# Patient Record
Sex: Female | Born: 1965 | Race: Black or African American | Hispanic: No | Marital: Single | Smoking: Former smoker
Health system: Southern US, Community
[De-identification: ages and names within clinical notes are randomized; demographics above are authoritative.]

## PROBLEM LIST (undated history)

## (undated) ENCOUNTER — Ambulatory Visit: Admission: EM | Payer: Self-pay | Source: Home / Self Care

## (undated) DIAGNOSIS — E119 Type 2 diabetes mellitus without complications: Secondary | ICD-10-CM

## (undated) DIAGNOSIS — G4730 Sleep apnea, unspecified: Secondary | ICD-10-CM

## (undated) DIAGNOSIS — R609 Edema, unspecified: Secondary | ICD-10-CM

## (undated) DIAGNOSIS — I10 Essential (primary) hypertension: Secondary | ICD-10-CM

## (undated) DIAGNOSIS — J449 Chronic obstructive pulmonary disease, unspecified: Secondary | ICD-10-CM

## (undated) DIAGNOSIS — I509 Heart failure, unspecified: Secondary | ICD-10-CM

## (undated) DIAGNOSIS — M1990 Unspecified osteoarthritis, unspecified site: Secondary | ICD-10-CM

## (undated) HISTORY — DX: Sleep apnea, unspecified: G47.30

## (undated) HISTORY — PX: HERNIA REPAIR: SHX51

## (undated) HISTORY — PX: TUBAL LIGATION: SHX77

---

## 2011-09-25 ENCOUNTER — Emergency Department (HOSPITAL_COMMUNITY)
Admission: EM | Admit: 2011-09-25 | Discharge: 2011-09-25 | Disposition: A | Discharge status: Home Health | Payer: Medicaid Other | Attending: Emergency Medicine | Admitting: Emergency Medicine

## 2011-09-25 ENCOUNTER — Other Ambulatory Visit: Payer: Self-pay

## 2011-09-25 ENCOUNTER — Encounter (HOSPITAL_COMMUNITY): Payer: Self-pay | Admitting: Emergency Medicine

## 2011-09-25 DIAGNOSIS — E669 Obesity, unspecified: Secondary | ICD-10-CM | POA: Insufficient documentation

## 2011-09-25 DIAGNOSIS — Z79899 Other long term (current) drug therapy: Secondary | ICD-10-CM | POA: Insufficient documentation

## 2011-09-25 DIAGNOSIS — I10 Essential (primary) hypertension: Secondary | ICD-10-CM | POA: Insufficient documentation

## 2011-09-25 HISTORY — DX: Essential (primary) hypertension: I10

## 2011-09-25 LAB — DIFFERENTIAL
Eosinophils Absolute: 0.5 10*3/uL (ref 0.0–0.7)
Eosinophils Relative: 9 % — ABNORMAL HIGH (ref 0–5)
Lymphocytes Relative: 27 % (ref 12–46)
Lymphs Abs: 1.6 10*3/uL (ref 0.7–4.0)
Monocytes Relative: 6 % (ref 3–12)
Neutrophils Relative %: 58 % (ref 43–77)

## 2011-09-25 LAB — CBC
Hemoglobin: 13.2 g/dL (ref 12.0–15.0)
MCH: 28 pg (ref 26.0–34.0)
MCV: 84.9 fL (ref 78.0–100.0)
RBC: 4.71 MIL/uL (ref 3.87–5.11)
WBC: 6.1 10*3/uL (ref 4.0–10.5)

## 2011-09-25 LAB — POCT I-STAT, CHEM 8
BUN: 14 mg/dL (ref 6–23)
Creatinine, Ser: 0.8 mg/dL (ref 0.50–1.10)
Glucose, Bld: 103 mg/dL — ABNORMAL HIGH (ref 70–99)
Hemoglobin: 14.6 g/dL (ref 12.0–15.0)
Potassium: 4.1 mEq/L (ref 3.5–5.1)
Sodium: 140 mEq/L (ref 135–145)

## 2011-09-25 LAB — POCT I-STAT TROPONIN I

## 2011-09-25 MED ORDER — OLMESARTAN MEDOXOMIL 20 MG PO TABS
20.0000 mg | ORAL_TABLET | Freq: Every day | ORAL | Status: DC
  Filled 2011-09-25: qty 1

## 2011-09-25 MED ORDER — AMLODIPINE BESYLATE 5 MG PO TABS
5.0000 mg | ORAL_TABLET | ORAL | Status: DC
  Filled 2011-09-25: qty 1

## 2011-09-25 MED ORDER — AMLODIPINE BESYLATE-VALSARTAN 10-320 MG PO TABS: 1.0000 | ORAL_TABLET | Freq: Every day | ORAL | Status: DC

## 2011-09-25 MED ORDER — OLMESARTAN MEDOXOMIL 40 MG PO TABS
40.0000 mg | ORAL_TABLET | Freq: Every day | ORAL | Status: DC
  Administered 2011-09-25: 40 mg via ORAL
  Filled 2011-09-25: qty 1

## 2011-09-25 MED ORDER — AMLODIPINE BESYLATE 10 MG PO TABS
10.0000 mg | ORAL_TABLET | Freq: Once | ORAL | Status: AC
  Administered 2011-09-25: 10 mg via ORAL
  Filled 2011-09-25: qty 1

## 2011-09-25 MED ORDER — AMLODIPINE BESYLATE 5 MG PO TABS
5.0000 mg | ORAL_TABLET | Freq: Once | ORAL | Status: DC
  Filled 2011-09-25: qty 1

## 2011-09-25 NOTE — ED Provider Notes (Signed)
History     CSN: [id number]  Arrival date & time 09/25/11  4098   First MD Initiated Contact with Patient 09/25/11 [phone]      Chief Complaint  Patient presents with  . Hypertension    (Consider location/radiation/quality/duration/timing/severity/associated sxs/prior treatment) HPI  Past Medical History  Diagnosis Date  . Asthma   . Hypertension     Past Surgical History  Procedure Date  . Hernia repair   . Cesarean section w/btl     No family history on file.  History  Substance Use Topics  . Smoking status: Former Games developer  . Smokeless tobacco: Never Used  . Alcohol Use: No    OB History    Grav Para Term Preterm Abortions TAB SAB Ect Mult Living                  Review of Systems  Allergies  Review of patient's allergies indicates no known allergies.  Home Medications  No current outpatient prescriptions on file.  BP 179/105  Pulse 75  Temp(Src) 98.3 F (36.8 C) (Oral)  Resp 18  Ht 5\' 5"  (1.651 m)  Wt 298 lb (135.172 kg)  BMI 49.59 kg/m2  SpO2 98%  LMP 08/22/2011  Physical Exam  ED Course  Procedures (including critical care time)  Labs Reviewed  POCT I-STAT, CHEM 8 - Abnormal; Notable for the following:    Glucose, Bld 103 (*)    All other components within normal limits  CBC  DIFFERENTIAL   No results found.   No diagnosis found.    MDM  Duplicate note        Doug Sou, MD 09/25/11 1655

## 2011-09-25 NOTE — ED Provider Notes (Signed)
Medical screening examination/treatment/procedure(s) were performed by non-physician practitioner and as supervising physician I was immediately available for consultation/collaboration.  Doug Sou, MD 09/25/11 [phone]

## 2011-09-25 NOTE — ED Provider Notes (Signed)
History     CSN: [id number]  Arrival date & time 09/25/11  3086   First MD Initiated Contact with Patient 09/25/11 [phone]      Chief Complaint  Patient presents with  . Hypertension    (Consider location/radiation/quality/duration/timing/severity/associated sxs/prior treatment) Patient is a 46 y.o. female presenting with hypertension. The history is provided by the patient.  Hypertension This is a chronic problem. Pertinent negatives include no chills or fever.  Pt states she has history of htn, used to be on medications but has been off for a year. States hx of drug use and has been going through rehab. Two weeks ago went to a pcp for a physical. States no blood work was done, but she was examined and restarted on BP medications for elevated BP. States was started on exforge. State she has been taking this medicine for a week now, but her blood pressure continues to be hight. She has it checked at [facility] where [facility] is staying. Pt denies any pain, denies headache, chest pain, shortness of breath, swelling of exrimities. Today did not take her medications. States she came here because "blood pressure continues to be high."  No past medical history on file.  No past surgical history on file.  No family history on file.  History  Substance Use Topics  . Smoking status: Not on file  . Smokeless tobacco: Not on file  . Alcohol Use: Not on file    OB History    No data available      Review of Systems  Constitutional: Negative for fever and chills.  HENT: Negative.   Eyes: Negative.   Respiratory: Negative.   Cardiovascular: Negative.   Gastrointestinal: Negative.   Genitourinary: Negative.   Musculoskeletal: Negative.   Skin: Negative.   Neurological: Negative.   Psychiatric/Behavioral: Negative.     Allergies  Review of patient's allergies indicates not on file.  Home Medications  No current outpatient prescriptions on file.  BP 179/105  Pulse 75  Temp(Src)  98.3 F (36.8 C) (Oral)  Resp 18  Ht 5\' 5"  (1.651 m)  Wt 298 lb (135.172 kg)  BMI 49.59 kg/m2  SpO2 98%  Physical Exam  Nursing note and vitals reviewed. Constitutional: She is oriented to person, place, and time. She appears well-developed and well-nourished.       obese  HENT:  Head: Normocephalic and atraumatic.  Eyes: Conjunctivae are normal.  Neck: Neck supple.  Cardiovascular: Normal rate, regular rhythm and normal heart sounds.   Pulmonary/Chest: Effort normal and breath sounds normal. No respiratory distress. She exhibits no tenderness.  Abdominal: Soft. Bowel sounds are normal. She exhibits no distension. There is no tenderness.  Musculoskeletal: Normal range of motion. She exhibits no edema and no tenderness.  Neurological: She is alert and oriented to person, place, and time.  Skin: Skin is warm and dry.  Psychiatric: She has a normal mood and affect.    ED Course  Procedures (including critical care time)   Pt has no current complaints, specifically denies chest pain, head ache, blurred vision, nausea, vomiting, swelling of exrimiteis. Will get ECG, istat to check renal function. Troponin.   Results for orders placed during the hospital encounter of 09/25/11  CBC      Component Value Range   WBC 6.1  4.0 - 10.5 (K/uL)   RBC 4.71  3.87 - 5.11 (MIL/uL)   Hemoglobin 13.2  12.0 - 15.0 (g/dL)   HCT 69.6  29.5 - 28.4 (%)  MCV 84.9  78.0 - 100.0 (fL)   MCH 28.0  26.0 - 34.0 (pg)   MCHC 33.0  30.0 - 36.0 (g/dL)   RDW 62.1  30.8 - 65.7 (%)   Platelets 268  150 - 400 (K/uL)  DIFFERENTIAL      Component Value Range   Neutrophils Relative 58  43 - 77 (%)   Neutro Abs 3.5  1.7 - 7.7 (K/uL)   Lymphocytes Relative 27  12 - 46 (%)   Lymphs Abs 1.6  0.7 - 4.0 (K/uL)   Monocytes Relative 6  3 - 12 (%)   Monocytes Absolute 0.4  0.1 - 1.0 (K/uL)   Eosinophils Relative 9 (*) 0 - 5 (%)   Eosinophils Absolute 0.5  0.0 - 0.7 (K/uL)   Basophils Relative 1  0 - 1 (%)    Basophils Absolute 0.0  0.0 - 0.1 (K/uL)  POCT I-STAT, CHEM 8      Component Value Range   Sodium 140  135 - 145 (mEq/L)   Potassium 4.1  3.5 - 5.1 (mEq/L)   Chloride 107  96 - 112 (mEq/L)   BUN 14  6 - 23 (mg/dL)   Creatinine, Ser 8.46  0.50 - 1.10 (mg/dL)   Glucose, Bld 962 (*) 70 - 99 (mg/dL)   Calcium, Ion 9.52  8.41 - 1.32 (mmol/L)   TCO2 25  0 - 100 (mmol/L)   Hemoglobin 14.6  12.0 - 15.0 (g/dL)   HCT 32.4  40.1 - 02.7 (%)  POCT I-STAT TROPONIN I      Component Value Range   Troponin i, poc 0.00  0.00 - 0.08 (ng/mL)   Comment 3            No results found.   Date: 09/25/2011  Rate: 70  Rhythm: normal sinus rhythm  QRS Axis: normal  Intervals: normal  ST/T Wave abnormalities: nonspecific T wave changes  Conduction Disutrbances:none  Narrative Interpretation:   Old EKG Reviewed: none available  Given pt increased dose of her regula medication for BP which she did not take. BP down to 145/85. Will increase disages, d/c home with follow up. Also instructed to stop naprosyn, tylenol for pain.   No results found.   No diagnosis found.    MDM          Lottie Mussel, PA 09/25/11 1620

## 2011-09-25 NOTE — ED Notes (Signed)
Felt "knot" in throat on Saturday, hx of drug abuse, has been clean for several weeks, is staying a [employer]

## 2011-09-25 NOTE — ED Notes (Signed)
Complains of elevated blood pressure patient asymptomatic. Reports that she had anterior chest pain for 4 seconds on 09/21/11 resolve spontaneously without treatment .she denies shortness of breath throat pain nausea abdominal pain headache or any other complaint. Recently started on ex-forge 5-160 clinic in [facility]. [facility] moved [facility] On exam alert nontoxic lungs clear auscultation heart regular rate and rhythm no murmurs abdomen morbidly obese nontender. Neurologic Glasgow Coma Score 15 cranial nerves II through XII was all extremities well  Assessment strongly doubt end organ damage acutely Suggest blood pressure recheck 1 week. Chest pain felt to be nonspecific, brief and self-limiting Will increase dose ofex-forge   Doug Sou, MD 09/25/11 [phone]

## 2011-09-25 NOTE — Discharge Instructions (Signed)
See print out below for resources for your follow up. You can also try [facility]. Take new prescribed high blood pressure medicine daily. Follow up in 1-2 weeks   RESOURCE GUIDE  Dental Problems  Patients with Medicaid: [facility] [phone] [address]                                           [phone] [address] Phone:  [phone]                                                  Phone:  [phone]  If unable to pay or uninsured, contact:  [facility] or [facility]. to become qualified for the adult dental clinic.  Chronic Pain Problems Contact [facility]  [phone] Patients need to be referred by their primary care doctor.  Insufficient Money for Medicine Contact [employer]:  call "211" or [facility] [phone].  No Primary Care Doctor Call [facility]  [phone] Other agencies that provide inexpensive medical care    [facility]  [phone]    [facility]  [phone]    [facility]  [phone]    [facility]  [phone]    [facility]  [phone]    [facility]  [phone]  Psychological Services [facility]  [phone] [facility]  [phone] [facility]   [phone] (emergency services [phone])  Substance Abuse Resources [facility]  [phone] [facility] [phone] [facility] [phone] Floydene Flock [phone] Residential & Outpatient Substance Abuse Program  [phone]  Abuse/Neglect [facility] Child Abuse Hotline 930-503-0156 [facility] Child Abuse Hotline 949-529-1079 (After Hours)  Emergency Shelter [facility] Minist[facility] [phone]  Maternity Homes [facility] [place] [facility] [phone] [facility] [phone]  MRSA Hotline #:   602-138-9623    [facility]  Resources  [facility]     [employer]                          [facility] Dept. [address] [place]                       [address]      [address]  [place]                                                Cristobal Goldmann Phone:  [phone]  Phone:  [phone]                 Phone:  [phone]  [facility] Mental [facility] Phone:  [phone]  [facility] Child Abuse Hotline 940-418-4058 [phone] (After Hours)    Hypertension Information As your heart beats, it forces blood through your arteries. This force is your blood pressure. If the pressure is too high, it is called hypertension (HTN) or high blood pressure. HTN is dangerous because you may have it and not know it. High blood pressure may mean that your heart has to work harder to pump blood. Your arteries may be narrow or stiff. The extra work puts you at risk for heart disease, stroke, and other problems.  Blood pressure consists of two numbers, a higher number over a lower, 110/72, for example. It is stated as "110 over 72." The ideal is below 120 for the top number (systolic) and under 80 for the bottom (diastolic).  You should pay close attention to your blood pressure if you have certain conditions such as:  Heart failure.   Prior heart attack.   Diabetes   Chronic kidney disease.   Prior stroke.   Multiple risk factors for heart disease.  To see if you have HTN, your blood pressure should be measured while you are seated with your arm held at the level of the heart. It should be measured at least twice. A one-time elevated blood pressure reading (especially in the Emergency Department) does not mean that you need treatment. There may be conditions in which the blood pressure is different between your right and left arms. It is important to see your caregiver soon for a recheck. Most people  have essential hypertension which means that there is not a specific cause. This type of high blood pressure may be lowered by changing lifestyle factors such as:  Stress.   Smoking.   Lack of exercise.   Excessive weight.   Drug/tobacco/alcohol use.   Eating less salt.  Most people do not have symptoms from high blood pressure until it has caused damage to the body. Effective treatment can often prevent, delay or reduce that damage. TREATMENT  Treatment for high blood pressure, when a cause has been identified, is directed at the cause. There are a large number of medications to treat HTN. These fall into several categories, and your caregiver will help you select the medicines that are best for you. Medications may have side effects. You should review side effects with your caregiver. If your blood pressure stays high after you have made lifestyle changes or started on medicines,   Your medication(s) may need to be changed.   Other problems may need to be addressed.   Be certain you understand your prescriptions, and know how and when to take your medicine.   Be sure to follow up with your caregiver within the time frame advised (usually within two weeks) to have your blood pressure rechecked and to review your medications.   If you are taking more than one medicine to lower your blood pressure, make sure you know how and at what times they should be taken. Taking two medicines at the same time can result in blood pressure that is too low.  Document Released: 09/24/2005 Document Revised: 04/03/2011 Document Reviewed: 10/01/2007 [facility] Patient Information 2012 [place], [place].

## 2011-10-16 ENCOUNTER — Other Ambulatory Visit: Payer: Self-pay

## 2011-10-16 ENCOUNTER — Emergency Department (HOSPITAL_COMMUNITY)
Admission: EM | Admit: 2011-10-16 | Discharge: 2011-10-17 | Disposition: A | Discharge status: Home / Self Care | Payer: Medicaid Other | Attending: Emergency Medicine | Admitting: Emergency Medicine

## 2011-10-16 ENCOUNTER — Encounter (HOSPITAL_COMMUNITY): Payer: Self-pay | Admitting: *Deleted

## 2011-10-16 ENCOUNTER — Emergency Department (HOSPITAL_COMMUNITY): Payer: Medicaid Other

## 2011-10-16 DIAGNOSIS — I10 Essential (primary) hypertension: Secondary | ICD-10-CM | POA: Insufficient documentation

## 2011-10-16 DIAGNOSIS — R142 Eructation: Secondary | ICD-10-CM | POA: Insufficient documentation

## 2011-10-16 DIAGNOSIS — R0789 Other chest pain: Secondary | ICD-10-CM | POA: Insufficient documentation

## 2011-10-16 DIAGNOSIS — M7989 Other specified soft tissue disorders: Secondary | ICD-10-CM | POA: Insufficient documentation

## 2011-10-16 DIAGNOSIS — R079 Chest pain, unspecified: Secondary | ICD-10-CM | POA: Insufficient documentation

## 2011-10-16 DIAGNOSIS — R141 Gas pain: Secondary | ICD-10-CM | POA: Insufficient documentation

## 2011-10-16 DIAGNOSIS — Z79899 Other long term (current) drug therapy: Secondary | ICD-10-CM | POA: Insufficient documentation

## 2011-10-16 DIAGNOSIS — R609 Edema, unspecified: Secondary | ICD-10-CM | POA: Insufficient documentation

## 2011-10-16 DIAGNOSIS — R0602 Shortness of breath: Secondary | ICD-10-CM | POA: Insufficient documentation

## 2011-10-16 MED ORDER — ASPIRIN 81 MG PO CHEW
81.0000 mg | CHEWABLE_TABLET | Freq: Once | ORAL | Status: AC
  Administered 2011-10-17: 81 mg via ORAL
  Filled 2011-10-16: qty 1

## 2011-10-16 NOTE — ED Notes (Addendum)
Pt in c/o bilateral lower extremity swelling and shortness of breath over last two days, pt speaking in full sentences, no distress noted, also chest tightness

## 2011-10-17 LAB — PRO B NATRIURETIC PEPTIDE: Pro B Natriuretic peptide (BNP): 34.6 pg/mL (ref 0–125)

## 2011-10-17 LAB — DIFFERENTIAL
Eosinophils Relative: 10 % — ABNORMAL HIGH (ref 0–5)
Lymphocytes Relative: 32 % (ref 12–46)
Lymphs Abs: 2.7 10*3/uL (ref 0.7–4.0)
Monocytes Absolute: 0.5 10*3/uL (ref 0.1–1.0)
Monocytes Relative: 6 % (ref 3–12)
Neutro Abs: 4.3 10*3/uL (ref 1.7–7.7)

## 2011-10-17 LAB — CBC
HCT: 37.9 % (ref 36.0–46.0)
Hemoglobin: 12.3 g/dL (ref 12.0–15.0)
MCV: 85.6 fL (ref 78.0–100.0)
RDW: 13.5 % (ref 11.5–15.5)
WBC: 8.3 10*3/uL (ref 4.0–10.5)

## 2011-10-17 LAB — POCT I-STAT, CHEM 8
Chloride: 106 mEq/L (ref 96–112)
Glucose, Bld: 110 mg/dL — ABNORMAL HIGH (ref 70–99)
HCT: 36 % (ref 36.0–46.0)
Hemoglobin: 12.2 g/dL (ref 12.0–15.0)
Potassium: 3.8 mEq/L (ref 3.5–5.1)
Sodium: 140 mEq/L (ref 135–145)

## 2011-10-17 LAB — CARDIAC PANEL(CRET KIN+CKTOT+MB+TROPI)
Relative Index: 1 (ref 0.0–2.5)
Troponin I: <0.3 ng/mL (ref <0.30)

## 2011-10-17 MED ORDER — FUROSEMIDE 20 MG PO TABS
20.0000 mg | ORAL_TABLET | Freq: Once | ORAL | Status: AC
  Administered 2011-10-17: 20 mg via ORAL
  Filled 2011-10-17 (×2): qty 1

## 2011-10-17 MED ORDER — FUROSEMIDE 20 MG PO TABS: 20.0000 mg | ORAL_TABLET | Freq: Every day | ORAL | Status: DC

## 2011-10-17 NOTE — ED Provider Notes (Signed)
Medical screening examination/treatment/procedure(s) were performed by non-physician practitioner and as supervising physician I was immediately available for consultation/collaboration.   Hanley Seamen, MD 10/17/11 [phone]

## 2011-10-17 NOTE — ED Provider Notes (Signed)
History     CSN: [id number]  Arrival date & time 10/16/11  1803   First MD Initiated Contact with Patient 10/16/11 2331      Chief Complaint  Patient presents with  . 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    Past Medical History  Diagnosis Date  . Asthma   . Hypertension     Past Surgical History  Procedure Date  . Hernia repair   . Cesarean section w/btl     History reviewed. No pertinent family history.  History  Substance Use Topics  . Smoking status: Former Games developer  . Smokeless tobacco: Never Used  . Alcohol Use: No    OB History    Grav Para Term Preterm Abortions TAB SAB Ect Mult Living                  Review of Systems  Constitutional: Negative for fever and chills.  HENT: Negative for congestion.   Respiratory: Positive for chest tightness and shortness of breath.   Cardiovascular: Positive for chest pain and leg swelling.  Gastrointestinal: Negative for nausea.  Genitourinary: Negative for dysuria.  Neurological: Negative for dizziness and weakness.    Allergies  Review of patient's allergies indicates no known allergies.  Home Medications   Current Outpatient Rx  Name Route Sig Dispense Refill  . AMLODIPINE BESYLATE-VALSARTAN 5-160 MG PO TABS Oral Take 1 tablet by mouth daily.    Marland Kitchen NAPROXEN 500 MG PO TABS Oral Take 1,000 mg by mouth 2 (two) times daily as needed. pain      BP 121/62  Pulse 66  Temp(Src) 98.1 F (36.7 C) (Oral)  Resp 20  SpO2 99%  LMP 09/22/2011  Physical Exam  Constitutional:  She is oriented to person, place, and time. She appears well-developed and well-nourished.  HENT:  Head: Normocephalic.  Eyes: Pupils are equal, round, and reactive to light.  Neck: Normal range of motion.  Cardiovascular: Normal rate.   No murmur heard. Pulmonary/Chest: No respiratory distress. She exhibits no tenderness.  Abdominal: Soft. Bowel sounds are normal. She exhibits distension. There is no tenderness.  Musculoskeletal: Normal range of motion. She exhibits edema. She exhibits no tenderness.  Neurological: She is alert and oriented to person, place, and time.  Skin: Skin is warm. No rash noted. No pallor.    ED Course  Procedures (including critical care time)   Labs Reviewed  CBC  DIFFERENTIAL  CARDIAC PANEL(CRET KIN+CKTOT+MB+TROPI)   Dg Chest 2 View  10/17/2011  *RADIOLOGY REPORT*  Clinical Data: Shortness of breath  CHEST - 2 VIEW  Comparison: None.  Findings: Heart size upper normal to mildly enlarged.  Mild central vascular congestion.  No focal consolidation.  Mild interstitial prominence.  No pleural effusion or pneumothorax.  No acute osseous abnormality. Bilateral shoulder degenerative changes.  IMPRESSION: Heart size upper normal to mildly enlarged, with central vascular congestion.  Mild interstitial prominence without focal consolidation.  Original Report Authenticated By: Waneta Martins, M.D.     No diagnosis found.  ED ECG REPORT   Date: 10/17/2011  EKG Time: 12:18 AM  Rate: 69  Rhythm: normal sinus rhythm,  normal  EKG, normal sinus rhythm,there are no previous tracings available for comparison  Axis: normal  Intervals:none  ST&T Change: non specific T wave changes   Narrative Interpretation:abnormal            MDM  To this patient's presentation, peripheral edema and shortness of breath with exertion, weight gain, abdominal girth enlargement, early [facility] and platelets are CHF with her history of poorly controlled hypertension, recently  restarted on antihypertensives Cardiac markers chest x-ray and BMP within normal limits.  I started patient on Lasix.  She does have an appointment with family practice to become established in about 2 weeks and encouraged that she may try to keep this appointment       Arman Filter, NP 10/17/11 [phone]

## 2011-10-17 NOTE — Discharge Instructions (Signed)
Peripheral Edema You have swelling in your legs (peripheral edema). This swelling is due to excess accumulation of salt and water in your body. Edema may be a sign of heart, kidney or liver disease, or a side effect of a medication. It may also be due to problems in the leg veins. Elevating your legs and using special support stockings may be very helpful, if the cause of the swelling is due to poor venous circulation. Avoid long periods of standing, whatever the cause. Treatment of edema depends on identifying the cause. Chips, pretzels, pickles and other salty foods should be avoided. Restricting salt in your diet is almost always needed. Water pills (diuretics) are often used to remove the excess salt and water from your body via urine. These medicines prevent the kidney from reabsorbing sodium. This increases urine flow. Diuretic treatment may also result in lowering of potassium levels in your body. Potassium supplements may be needed if you have to use diuretics daily. Daily weights can help you keep track of your progress in clearing your edema. You should call your caregiver for follow up care as recommended. SEEK IMMEDIATE MEDICAL CARE IF:   You have increased swelling, pain, redness, or heat in your legs.   You develop shortness of breath, especially when lying down.   You develop chest or abdominal pain, weakness, or fainting.   You have a fever.  Document Released: 08/29/2004 Document Revised: 07/11/2011 Document Reviewed: 08/09/2009 [facility] Patient Information 2012 [place], [place]. Today's chest x-ray revealed slight congestion.  The cardiac enzymes and your lab work were all within normal limits. You have  been placed on Lasix 20 mg tablets daily for 15 days.  Please make sure you keep your from a family practice to become established

## 2011-11-07 ENCOUNTER — Other Ambulatory Visit: Payer: Self-pay

## 2011-11-07 ENCOUNTER — Emergency Department (HOSPITAL_COMMUNITY)
Admission: EM | Admit: 2011-11-07 | Discharge: 2011-11-07 | Disposition: A | Discharge status: Home / Self Care | Payer: Medicaid Other | Attending: Emergency Medicine | Admitting: Emergency Medicine

## 2011-11-07 ENCOUNTER — Emergency Department (HOSPITAL_COMMUNITY): Payer: Medicaid Other

## 2011-11-07 ENCOUNTER — Encounter (HOSPITAL_COMMUNITY): Payer: Self-pay | Admitting: *Deleted

## 2011-11-07 DIAGNOSIS — I509 Heart failure, unspecified: Secondary | ICD-10-CM | POA: Insufficient documentation

## 2011-11-07 DIAGNOSIS — Z8739 Personal history of other diseases of the musculoskeletal system and connective tissue: Secondary | ICD-10-CM | POA: Insufficient documentation

## 2011-11-07 DIAGNOSIS — I10 Essential (primary) hypertension: Secondary | ICD-10-CM | POA: Insufficient documentation

## 2011-11-07 DIAGNOSIS — Z760 Encounter for issue of repeat prescription: Secondary | ICD-10-CM | POA: Insufficient documentation

## 2011-11-07 DIAGNOSIS — R0602 Shortness of breath: Secondary | ICD-10-CM

## 2011-11-07 HISTORY — DX: Unspecified osteoarthritis, unspecified site: M19.90

## 2011-11-07 HISTORY — DX: Edema, unspecified: R60.9

## 2011-11-07 HISTORY — DX: Heart failure, unspecified: I50.9

## 2011-11-07 LAB — DIFFERENTIAL
Basophils Absolute: 0 10*3/uL (ref 0.0–0.1)
Basophils Relative: 0 % (ref 0–1)
Eosinophils Absolute: 0.9 10*3/uL — ABNORMAL HIGH (ref 0.0–0.7)
Eosinophils Relative: 12 % — ABNORMAL HIGH (ref 0–5)
Monocytes Absolute: 0.5 10*3/uL (ref 0.1–1.0)

## 2011-11-07 LAB — CBC
HCT: 36 % (ref 36.0–46.0)
MCH: 27.9 pg (ref 26.0–34.0)
MCHC: 32.5 g/dL (ref 30.0–36.0)
MCV: 85.7 fL (ref 78.0–100.0)
RDW: 13.5 % (ref 11.5–15.5)

## 2011-11-07 LAB — BASIC METABOLIC PANEL
BUN: 13 mg/dL (ref 6–23)
CO2: 29 mEq/L (ref 19–32)
Chloride: 105 mEq/L (ref 96–112)
Creatinine, Ser: 0.91 mg/dL (ref 0.50–1.10)
GFR calc Af Amer: 86 mL/min — ABNORMAL LOW (ref >90)

## 2011-11-07 MED ORDER — FUROSEMIDE 20 MG PO TABS: 20.0000 mg | ORAL_TABLET | Freq: Two times a day (BID) | ORAL | Status: DC

## 2011-11-07 MED ORDER — FUROSEMIDE 40 MG PO TABS
40.0000 mg | ORAL_TABLET | Freq: Once | ORAL | Status: AC
  Administered 2011-11-07: 40 mg via ORAL
  Filled 2011-11-07 (×2): qty 1

## 2011-11-07 NOTE — ED Provider Notes (Signed)
History     CSN: [id number]  Arrival date & time 11/07/11  1006   First MD Initiated Contact with Patient 11/07/11 1018      Chief Complaint  Patient presents with  . Shortness of Breath  . Medication Refill    lasix    (Consider location/radiation/quality/duration/timing/severity/associated sxs/prior treatment) HPI Comments: Patient with a history of hypertension,CHF, poly SA (crack & alc) presents emergency department with chief complaint of worsening shortness of breath and chest pain.  Pain is located in the left side of her chest, radiates to her left shoulder, is intermittent lasting seconds, 4/10 in severity, described as deep twisting pain in associated symptoms include worsening shortness of breath, dyspnea on exertion, bilateral lower leg edema, orthopnea, and PND.  Patient states that she has recently moved here and established PCP with Dr.Piloto, her first appointment is scheduled for April 18.  Patient was seen in this emergency department and started on Lasix 20 mg daily and Exforge, however she has been out of this medication for 3 days. Pt has no other complaints at this time.   Patient is a 46 y.o. female presenting with shortness of breath. The history is provided by the patient.  Shortness of Breath  Associated symptoms include chest pain and shortness of breath. Pertinent negatives include no fever, no stridor, no cough and no wheezing.    Past Medical History  Diagnosis Date  . Hypertension   . Arthritis   . Edema   . CHF (congestive heart failure)     Past Surgical History  Procedure Date  . Hernia repair   . Cesarean section w/btl     No family history on file.  History  Substance Use Topics  . Smoking status: Former Games developer  . Smokeless tobacco: Never Used  . Alcohol Use: No    OB History    Grav Para Term Preterm Abortions TAB SAB Ect Mult Living                  Review of Systems  Constitutional: Negative for fever, chills and appetite  change.  HENT: Negative for congestion.   Eyes: Negative for visual disturbance.  Respiratory: Positive for chest tightness and shortness of breath. Negative for cough, choking, wheezing and stridor.   Cardiovascular: Positive for chest pain and leg swelling. Negative for palpitations.  Gastrointestinal: Negative for abdominal pain.  Genitourinary: Negative for dysuria, urgency and frequency.  Neurological: Negative for dizziness, syncope, weakness, light-headedness, numbness and headaches.  Psychiatric/Behavioral: Negative for confusion.  All other systems reviewed and are negative.    Allergies  Review of patient's allergies indicates no known allergies.  Home Medications   Current Outpatient Rx  Name Route Sig Dispense Refill  . AMLODIPINE BESYLATE-VALSARTAN 5-160 MG PO TABS Oral Take 1 tablet by mouth daily.    . FUROSEMIDE 20 MG PO TABS Oral Take 1 tablet (20 mg total) by mouth daily. 15 tablet 0  . NAPROXEN 500 MG PO TABS Oral Take 1,000 mg by mouth 2 (two) times daily as needed. pain      BP 106/63  Pulse 71  Temp(Src) 98.4 F (36.9 C) (Oral)  Resp 20  SpO2 100%  LMP 11/04/2011  Physical Exam  Nursing note and vitals reviewed. Constitutional: She appears well-developed and well-nourished. No distress.  HENT:  Head: Normocephalic and atraumatic.  Eyes: Conjunctivae and EOM are normal. Pupils are equal, round, and reactive to light.  Neck: Normal range of motion. Neck supple. Normal carotid pulses  and no JVD present. Carotid bruit is not present. No rigidity. Normal range of motion present.  Cardiovascular: Normal rate, regular rhythm, S1 normal, S2 normal, normal heart sounds, intact distal pulses and normal pulses.  Exam reveals no gallop and no friction rub.   No murmur heard.      1+ pitting edema bilaterally, RRR, no aberrant sounds on auscultations, distal pulses intact, no carotid bruit or JVD.   Pulmonary/Chest: Effort normal and breath sounds normal. No  accessory muscle usage or stridor. No respiratory distress. She exhibits no tenderness and no bony tenderness.  Abdominal: Bowel sounds are normal.       Soft non tender. Non pulsatile aorta.   Skin: Skin is warm, dry and intact. No rash noted. She is not diaphoretic. No cyanosis. Nails show no clubbing.    ED Course  Procedures (including critical care time)  Labs Reviewed  BASIC METABOLIC PANEL - Abnormal; Notable for the following:    Glucose, Bld 103 (*)    GFR calc non Af Amer 74 (*)    GFR calc Af Amer 86 (*)    All other components within normal limits  CBC - Abnormal; Notable for the following:    Hemoglobin 11.7 (*)    All other components within normal limits  DIFFERENTIAL - Abnormal; Notable for the following:    Eosinophils Relative 12 (*)    Eosinophils Absolute 0.9 (*)    All other components within normal limits  PRO B NATRIURETIC PEPTIDE  POCT I-STAT TROPONIN I   Dg Chest 2 View  11/07/2011  *RADIOLOGY REPORT*  Clinical Data: Shortness of breath with chest pain  CHEST - 2 VIEW  Comparison: 10/16/2011  Findings: Mild cardiac enlargement persists.  Mild central vascular congestion without overt failure.  No consolidation.  No effusion or pneumothorax.  Bones unremarkable other than mild DJD both shoulders.  IMPRESSION: Mild cardiac enlargement and slight vascular congestion without overt failure.  A similar appearance was seen on the most recent prior exam.  Original Report Authenticated By: Elsie Stain, M.D.     No diagnosis found.    Date: 11/07/2011  Rate: 68  Rhythm: normal sinus rhythm  QRS Axis: normal  Intervals: normal  ST/T Wave abnormalities: normal  Conduction Disutrbances: none  Narrative Interpretation:   Old EKG Reviewed: No significant changes noted    MDM  CP, SOB, Med refill  Patient is to be discharged with recommendation to keep follow up with PCP in regards to today's hospital visit. Chest pain is not likely of cardiac or pulmonary  etiology d/t presentation, perc negative, VSS, no tracheal deviation, no JVD or new murmur, RRR, breath sounds equal bilaterally, EKG without acute abnormalities, negative troponin, and negative CXR. Pt has been advised to return to the ED is CP becomes exertional, associated with diaphoresis or nausea, radiates to left jaw/arm, worsens or becomes concerning in any way. Pt appears reliable for follow up and is agreeable to discharge. Pt given medication refill for her lasix, CXR does not show worsening CHF and bnp is not elevated. Recommended sleep apnea study,   Case has been discussed with and seen by Dr. Rosalia Hammers who agrees with the above plan to discharge.          Jaci Carrel, [place] 11/07/11 1315

## 2011-11-07 NOTE — Discharge Instructions (Signed)
Shortness of Breath Shortness of breath (dyspnea) is the feeling of uneasy breathing. Shortness of breath does not always mean that there is a life-threatening illness. However, shortness of breath requires immediate medical care. CAUSES  Causes for shortness of breath include:  Not enough oxygen in the air (as with high altitudes or with a smoke-filled room).   Short-term (acute) lung disease, including:   Infections such as pneumonia.   Fluid in the lungs, such as heart failure.   A blood clot in the lungs (pulmonary embolism).   Lasting (chronic) lung diseases.   Heart disease (heart attack, angina, heart failure, and others).   Low red blood cells (anemia).   Poor physical fitness. This can cause shortness of breath when you exercise.   Chest or back injuries or stiffness.   Being overweight (obese).   Anxiety. This can make you feel like you are not getting enough air.  DIAGNOSIS  Serious medical problems can usually be found during your physical exam. Many tests may also be done to determine why you are having shortness of breath. Tests include:  Chest X-rays.   Lung function tests.   Blood tests.   Electrocardiography.   Exercise testing.   A cardiac echo.   Imaging scans.  Your caregiver may not be able to find a cause for your shortness of breath after your exam. In this case, it is important to have a follow-up exam with your caregiver as directed.  HOME CARE INSTRUCTIONS   Do not smoke. Smoking is a common cause of shortness of breath. Ask for help to stop smoking.   Avoid being around chemicals that may bother your breathing (paint fumes, dust).   Rest as needed. Slowly resume your usual activities.   If medicines were prescribed, take them as directed for the full length of time directed. This includes oxygen and any inhaled medicines.   Follow up with your caregiver as directed. Waiting to do so or failure to follow up could result in worsening of  your condition and possible disability or death.   Be sure you understand what to do or who to call if your shortness of breath worsens.  SEEK MEDICAL CARE IF:   Your condition does not improve in the time expected.   You have a hard time doing your normal activities even with rest.   You have any side effects or problems with the medicines prescribed.   You develop any new symptoms.  SEEK IMMEDIATE MEDICAL CARE IF:   Your shortness of breath is getting worse.   You feel lightheaded, faint, or develop a cough not controlled with medicines.   You start coughing up blood.   You have pain with breathing.   You have chest pain or pain in your arms, shoulders, or abdomen.   You have a fever.   You are unable to walk up stairs or exercise the way you normally do.   Your symptoms are getting worse.  Document Released: 04/16/2001 Document Revised: 07/11/2011 Document Reviewed: 12/02/2007 [facility] Patient Information 2012 [place], [place].Shortness of Breath Shortness of breath (dyspnea) is the feeling of uneasy breathing. Shortness of breath does not always mean that there is a life-threatening illness. However, shortness of breath requires immediate medical care. CAUSES  Causes for shortness of breath include:  Not enough oxygen in the air (as with high altitudes or with a smoke-filled room).   Short-term (acute) lung disease, including:   Infections such as pneumonia.   Fluid in the  lungs, such as heart failure.   A blood clot in the lungs (pulmonary embolism).   Lasting (chronic) lung diseases.   Heart disease (heart attack, angina, heart failure, and others).   Low red blood cells (anemia).   Poor physical fitness. This can cause shortness of breath when you exercise.   Chest or back injuries or stiffness.   Being overweight (obese).   Anxiety. This can make you feel like you are not getting enough air.  DIAGNOSIS  Serious medical problems can usually be found  during your physical exam. Many tests may also be done to determine why you are having shortness of breath. Tests include:  Chest X-rays.   Lung function tests.   Blood tests.   Electrocardiography.   Exercise testing.   A cardiac echo.   Imaging scans.  Your caregiver may not be able to find a cause for your shortness of breath after your exam. In this case, it is important to have a follow-up exam with your caregiver as directed.  HOME CARE INSTRUCTIONS   Do not smoke. Smoking is a common cause of shortness of breath. Ask for help to stop smoking.   Avoid being around chemicals that may bother your breathing (paint fumes, dust).   Rest as needed. Slowly resume your usual activities.   If medicines were prescribed, take them as directed for the full length of time directed. This includes oxygen and any inhaled medicines.   Follow up with your caregiver as directed. Waiting to do so or failure to follow up could result in worsening of your condition and possible disability or death.   Be sure you understand what to do or who to call if your shortness of breath worsens.  SEEK MEDICAL CARE IF:   Your condition does not improve in the time expected.   You have a hard time doing your normal activities even with rest.   You have any side effects or problems with the medicines prescribed.   You develop any new symptoms.  SEEK IMMEDIATE MEDICAL CARE IF:   Your shortness of breath is getting worse.   You feel lightheaded, faint, or develop a cough not controlled with medicines.   You start coughing up blood.   You have pain with breathing.   You have chest pain or pain in your arms, shoulders, or abdomen.   You have a fever.   You are unable to walk up stairs or exercise the way you normally do.   Your symptoms are getting worse.  Document Released: 04/16/2001 Document Revised: 07/11/2011 Document Reviewed: 12/02/2007 [facility] Patient Information 2012 [place],  [place].

## 2011-11-07 NOTE — ED Provider Notes (Signed)
History/physical exam/procedure(s) were performed by non-physician practitioner and as supervising physician I was immediately available for consultation/collaboration. I have reviewed all notes and am in agreement with care and plan.   Hilario Quarry, MD 11/07/11 [phone]

## 2011-11-07 NOTE — ED Notes (Signed)
Per pt.  Shortness of breath started two days ago and pt ran out of her lasix 20 mg last Friday.  Pt also reporting left sided pressure and pain. Pt reports pain feels deep.  Pt denies productive cough or congestion.  [place] and has an appointment with a new MD on 4/18.

## 2011-11-21 ENCOUNTER — Encounter: Payer: Self-pay | Admitting: Family Medicine

## 2011-11-21 ENCOUNTER — Ambulatory Visit (INDEPENDENT_AMBULATORY_CARE_PROVIDER_SITE_OTHER): Payer: Medicaid Other | Admitting: Family Medicine

## 2011-11-21 VITALS — BP 140/92 | HR 80 | Temp 98.0°F | Ht 65.0 in | Wt 320.0 lb

## 2011-11-21 DIAGNOSIS — F1011 Alcohol abuse, in remission: Secondary | ICD-10-CM

## 2011-11-21 DIAGNOSIS — I10 Essential (primary) hypertension: Secondary | ICD-10-CM

## 2011-11-21 DIAGNOSIS — E669 Obesity, unspecified: Secondary | ICD-10-CM

## 2011-11-21 DIAGNOSIS — R0602 Shortness of breath: Secondary | ICD-10-CM

## 2011-11-21 DIAGNOSIS — F1411 Cocaine abuse, in remission: Secondary | ICD-10-CM

## 2011-11-21 MED ORDER — POTASSIUM CHLORIDE CRYS ER 20 MEQ PO TBCR: 20.0000 meq | EXTENDED_RELEASE_TABLET | ORAL | Status: DC

## 2011-11-21 MED ORDER — ASPIRIN 81 MG PO TABS: 81.0000 mg | ORAL_TABLET | Freq: Every day | ORAL | Status: DC

## 2011-11-21 NOTE — Patient Instructions (Addendum)
It has been a pleasure to see you today. Please take the medications as prescribed. Make an appointment here for labs. You will receive a call with the appointment scheduled for an ECHO (ultrasound of your heart) I will call you with the labs results if they come back abnormal otherwise you will receive a letter. Make your next appointment for f/u in 1 month. If you develop any chest pain, intense shortness of breath please get evaluated right away.

## 2011-11-22 ENCOUNTER — Encounter: Payer: Self-pay | Admitting: Family Medicine

## 2011-11-22 ENCOUNTER — Telehealth: Payer: Self-pay | Admitting: Family Medicine

## 2011-11-22 DIAGNOSIS — I10 Essential (primary) hypertension: Secondary | ICD-10-CM | POA: Insufficient documentation

## 2011-11-22 DIAGNOSIS — F1011 Alcohol abuse, in remission: Secondary | ICD-10-CM | POA: Insufficient documentation

## 2011-11-22 DIAGNOSIS — F1411 Cocaine abuse, in remission: Secondary | ICD-10-CM | POA: Insufficient documentation

## 2011-11-22 MED ORDER — FUROSEMIDE 20 MG PO TABS: 20.0000 mg | ORAL_TABLET | Freq: Two times a day (BID) | ORAL | Status: DC

## 2011-11-22 MED ORDER — AMLODIPINE BESYLATE-VALSARTAN 5-160 MG PO TABS: 1.0000 | ORAL_TABLET | Freq: Every day | ORAL | Status: DC

## 2011-11-22 NOTE — Telephone Encounter (Signed)
Patient is calling because when she was in yesterday, she asked for refills on Exforge and Furosemide to go to [facility], but they don't have them yet.  She would also like for Dr. Aviva Signs to call her back.

## 2011-11-22 NOTE — Assessment & Plan Note (Signed)
On Amlodipine-Valsartan. She states this combination seems help her but today BP were 140/92. Definitively no first line of treatment. There is no information for how long she has been hypertensive and what other drugs has been tried in the past.  Plan: Will continue medication so we are not making to many changes at once. And will address this on her next appointment. Already discussed red flags and pt agreeable to keep a daily BP log until her next visit.

## 2011-11-22 NOTE — Telephone Encounter (Signed)
Returned call to patient.  Was started on Exforge and furosemide.  Will be out of meds tomorrow.  Patient states she has to take Exforge 10/320mg  instead of 5/160mg  because it controls her BP better.  Paged Dr. Aviva Signs and she will refill meds to patient's pharmacy.  Patient informed.  Gaylene Brooks, RN

## 2011-11-22 NOTE — Assessment & Plan Note (Addendum)
Morbidly obese. BMI: 53.25 Plan: Already addressed in prior problem.

## 2011-11-22 NOTE — Progress Notes (Signed)
  Subjective:    Patient ID: Kiara Cooper, female    DOB: 14-Apr-1966, 46 y.o.   MRN: [id number]  HPI This is my first time seen Kiara Cooper . She comes today to establish care. She was recently seen at [facility] emergency department due to lower extremity edema and difficulty breathing. She was started on furosemide 20 mg twice a day, and continued on amlodipine-valsartan for her blood pressure. She was told had some kind of heart failure but there is no ECHO documenting this.   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 She also was a heavy smoker one half to one pack a day for 28 years that has quit on 09/12/2011. Patient is morbidly obese and her past medical history is significant for arthritis in her knees ankles and shoulder, as well as hypertension.  Today her main complain is her difficulty breathing, that limits her daily activities like walking, or any other kind of exertion. She has orthopnea, using 1 pillow rolled to prop herself up at night. She can speak in complete sentences and there is no difficulty breathing at rest during the visit today. She reports that she has chronically productive cough in the morning, that is clear. Afebrile. No chills.  No palpitations or chest pain reported. No dizziness, change on vision, and denies weakness.   Review of Systems    Per HPI Objective:   Physical Exam  Constitutional: She is oriented to person, place, and time. No distress.  HENT:  Head: Normocephalic and atraumatic.  Right Ear: External ear normal.  Left Ear: External ear normal.  Mouth/Throat: Oropharynx is clear and moist. No oropharyngeal exudate.  Eyes: Conjunctivae and EOM are normal. Pupils are equal, round, and reactive to light.  Neck: Normal range of motion. Neck supple. No JVD present. No thyromegaly present.  Cardiovascular: Normal rate, regular  rhythm and normal heart sounds.  Exam reveals no gallop and no friction rub.   No murmur heard. Pulmonary/Chest: Effort normal and breath sounds normal. No respiratory distress. She has no wheezes. She has no rales. She exhibits no tenderness.  Abdominal: Bowel sounds are normal.          Musculoskeletal:       Mild 1+ pitting edema.  Lymphadenopathy:    She has no cervical adenopathy.  Neurological: She is alert and oriented to person, place, and time. She has normal reflexes.          Assessment & Plan:

## 2011-11-22 NOTE — Assessment & Plan Note (Signed)
Couple of sources of this non acute, mostly exertional SOB:  1. Respiratory: Pt former smoker with productive cough. 2. Cardiovascular: Most likely the exertional and orthopneic component. Since recent .CXR showed mild cardiac enlargement and slight vascular congestion without overt failure. EKG's (7 in total since Feb/13) showing NONSPECIFIC T ABNORMALITIES, LATERAL LEADS ~ T <-0.2mV, I aVL V5 V6. With no changes and no prior to compare. Pt ex cocaine/ alcohol abuser. No controlled HTN.  3. Body habitus: Morbidly obese pt, is definitively an aggravating factor. Plan: 1. Pt quited smoking since Feb, positive reinforcement during our visit given. Possible evaluation with PFT's once cardiac cause is addressed. 2. Stopped Cocaine and Alcohol abuse. Also positive reinforcement during visit. Started on ASA daily 81 mg. Continue Furosemide 20 mg BID and KCl twice a week. Ordered ECHO, Lipid profile, TSH, (f/u creatinine on cmet)  3. Pt starting to make better choices regarding her diet. Goal at this point is not increase her weight. Once this is accomplished we will focus in decreasing weight strategies. A1C, CMET,

## 2011-11-25 ENCOUNTER — Telehealth: Payer: Self-pay | Admitting: *Deleted

## 2011-11-25 NOTE — Telephone Encounter (Addendum)
Received fax from [facility] about Exforge dosage. Paged Dr. Aviva Signs by phone and text.

## 2011-11-26 ENCOUNTER — Other Ambulatory Visit: Payer: Self-pay | Admitting: Family Medicine

## 2011-11-26 MED ORDER — AMLODIPINE BESYLATE-VALSARTAN 10-320 MG PO TABS: 1.0000 | ORAL_TABLET | Freq: Every day | ORAL | Status: DC

## 2011-11-26 NOTE — Telephone Encounter (Signed)
Paged Dr. Aviva Signs this AM and she will send in correct dosage.   Message left on voicemail for patient. Advised if she has any of the 5/160 mg tabs left can use these taking 2 at the time. New RX has been sent and take 1 daily.

## 2011-11-28 ENCOUNTER — Other Ambulatory Visit: Payer: Medicaid Other

## 2011-11-28 DIAGNOSIS — E669 Obesity, unspecified: Secondary | ICD-10-CM

## 2011-11-28 NOTE — Progress Notes (Signed)
FLP,CMP,CBC AND TSH DONE TODAY Kiara Cooper 

## 2011-11-29 ENCOUNTER — Telehealth: Payer: Self-pay | Admitting: Family Medicine

## 2011-11-29 LAB — CBC
MCHC: 30.6 g/dL (ref 30.0–36.0)
Platelets: 301 10*3/uL (ref 150–400)
RDW: 14 % (ref 11.5–15.5)
WBC: 7.8 10*3/uL (ref 4.0–10.5)

## 2011-11-29 LAB — COMPREHENSIVE METABOLIC PANEL
ALT: 15 U/L (ref 0–35)
AST: 21 U/L (ref 0–37)
Albumin: 4.2 g/dL (ref 3.5–5.2)
Alkaline Phosphatase: 74 U/L (ref 39–117)
Calcium: 9.2 mg/dL (ref 8.4–10.5)
Chloride: 105 mEq/L (ref 96–112)
Creat: 0.85 mg/dL (ref 0.50–1.10)
Potassium: 4.1 mEq/L (ref 3.5–5.3)

## 2011-11-29 LAB — LIPID PANEL
HDL: 43 mg/dL (ref >39)
LDL Cholesterol: 56 mg/dL (ref 0–99)
Total CHOL/HDL Ratio: 2.9 Ratio

## 2011-11-29 NOTE — Telephone Encounter (Signed)
Fax received from pharmacy for furosemide . RX was put in 04/19 but RX was not sent , was printed.  Directions are to take two times daily . Called in # 60 tabs instead  of #30 as stated. Will send message to Dr. Aviva Signs to verify that this correct.

## 2011-11-29 NOTE — Telephone Encounter (Signed)
States that the pharmacy still doesn't have refill for her furosemide.  [facility] pls advise

## 2011-11-29 NOTE — Telephone Encounter (Signed)
Spoke with patient to advise that rx has been sent. States she has been out of furosemide for a week and now feet are very swollen.  Also when ask if she is having shortness of breath she states yes. Offered appointment here today but she states she wants to pick up RX and take first then if not improving will call. Strongly encouraged appointment today.

## 2011-12-02 ENCOUNTER — Other Ambulatory Visit: Payer: Self-pay | Admitting: Family Medicine

## 2011-12-02 MED ORDER — FUROSEMIDE 20 MG PO TABS: 20.0000 mg | ORAL_TABLET | Freq: Two times a day (BID) | ORAL | Status: DC

## 2011-12-02 NOTE — Telephone Encounter (Signed)
Medication has been sent in with 3 refills.

## 2011-12-04 ENCOUNTER — Ambulatory Visit (HOSPITAL_COMMUNITY): Payer: Medicaid Other | Attending: Cardiology

## 2011-12-04 ENCOUNTER — Other Ambulatory Visit: Payer: Self-pay

## 2011-12-04 DIAGNOSIS — F1411 Cocaine abuse, in remission: Secondary | ICD-10-CM | POA: Insufficient documentation

## 2011-12-04 DIAGNOSIS — R0609 Other forms of dyspnea: Secondary | ICD-10-CM | POA: Insufficient documentation

## 2011-12-04 DIAGNOSIS — Z87891 Personal history of nicotine dependence: Secondary | ICD-10-CM | POA: Insufficient documentation

## 2011-12-04 DIAGNOSIS — F1021 Alcohol dependence, in remission: Secondary | ICD-10-CM | POA: Insufficient documentation

## 2011-12-04 DIAGNOSIS — R0989 Other specified symptoms and signs involving the circulatory and respiratory systems: Secondary | ICD-10-CM | POA: Insufficient documentation

## 2011-12-04 DIAGNOSIS — I10 Essential (primary) hypertension: Secondary | ICD-10-CM | POA: Insufficient documentation

## 2011-12-04 DIAGNOSIS — I517 Cardiomegaly: Secondary | ICD-10-CM | POA: Insufficient documentation

## 2011-12-04 DIAGNOSIS — R0602 Shortness of breath: Secondary | ICD-10-CM

## 2011-12-04 DIAGNOSIS — I519 Heart disease, unspecified: Secondary | ICD-10-CM | POA: Insufficient documentation

## 2011-12-06 ENCOUNTER — Telehealth: Payer: Self-pay | Admitting: *Deleted

## 2011-12-06 NOTE — Telephone Encounter (Signed)
Dr. Delena Bali office (Dentist)  calling to see if patient needs pre-medication before dental procedure. States patient told them she has been dx with congestive heart failture.  I did not see any reason in patients chart that pre-meds would be needed for dental procedure.  Advised Dr. Delena Bali office that pre medication were not indicated.  Kiara Cooper

## 2011-12-09 ENCOUNTER — Telehealth: Payer: Self-pay | Admitting: Family Medicine

## 2011-12-09 NOTE — Telephone Encounter (Signed)
I am trying to reach Kiara Cooper to inform that Kiara Cooper ECHO is negative for cardiac failure. Recommendation is to STOP FUROSEMIDE AND KCL. Continue Kiara Cooper blood pressure medication and schedule a f/u appointment with me to adjust Kiara Cooper BP meds. Will route this to our team in order to deliver this information to Kiara Cooper since the phone number provided is "busy" all the time I was trying to reach Kiara Cooper.

## 2011-12-10 ENCOUNTER — Encounter: Payer: Self-pay | Admitting: *Deleted

## 2011-12-10 ENCOUNTER — Telehealth: Payer: Self-pay | Admitting: *Deleted

## 2011-12-10 NOTE — Telephone Encounter (Signed)
Patient received message and calling back about plan of care.  Patient states she has SOB when she is not taking furosemide.  Patient does not want to d/c meds because she "feels like knot in throat" and states knot is better when she takes furosemide.  Patient has an appt on 12/26/11, but requesting sooner appt to discuss BP meds and heart.  Appt scheduled with Dr. Aviva Signs for 12/16/11 at 1:30pm.  Gaylene Brooks, RN

## 2011-12-10 NOTE — Telephone Encounter (Signed)
Called and informed pt of recommendations that Dr. Aviva Signs has set forth and asked that she call back to set up a f/u appt to make changes to her bp meds.Loralee Pacas [place]

## 2011-12-10 NOTE — Telephone Encounter (Signed)
Message copied by Deno Etienne on Tue Dec 10, 2011  8:34 AM ------      Message from: PILOTO DE Criselda Peaches, DAYARMYS      Created: Mon Dec 09, 2011  4:00 PM       I am trying to reach Ms. Golberg to inform that her ECHO is negative for cardiac failure. Recommendation is to STOP FUROSEMIDE AND KCL(Potasium pills). Continue her blood pressure medication and schedule a f/u appointment with me to adjust her BP meds. The phone number provided is constantly "busy" and I can't contact her.

## 2011-12-10 NOTE — Telephone Encounter (Signed)
This encounter was created in error - please disregard.

## 2011-12-11 ENCOUNTER — Ambulatory Visit (INDEPENDENT_AMBULATORY_CARE_PROVIDER_SITE_OTHER): Payer: Medicaid Other | Admitting: Family Medicine

## 2011-12-11 ENCOUNTER — Encounter: Payer: Self-pay | Admitting: Family Medicine

## 2011-12-11 VITALS — BP 104/72 | HR 72 | Temp 98.4°F | Ht 65.0 in | Wt 315.0 lb

## 2011-12-11 DIAGNOSIS — M545 Low back pain, unspecified: Secondary | ICD-10-CM

## 2011-12-11 DIAGNOSIS — R0602 Shortness of breath: Secondary | ICD-10-CM

## 2011-12-11 MED ORDER — MELOXICAM 15 MG PO TABS: 15.0000 mg | ORAL_TABLET | Freq: Every day | ORAL | Status: DC

## 2011-12-11 NOTE — Progress Notes (Signed)
  Subjective:    Patient ID: Kiara Cooper, female    DOB: 1965-09-02, 46 y.o.   MRN: [id number]  HPI Work in appt for low back pain  Low back pain:  Last night back started hurting when she stood up out of a chair.  Low back, more on right side.  No radiation.  States currently 10/10.  No numbness, tingling, weakness, inability to control bowels or bladder. States was has diagnosis of arthritis.  Does not sound inflammatory.  Dyspnea:  continued dyspnea- described as tightness in neck, upper chest area.  Not able to chase after grandchild without being dyspneic.  See last office note from PCP for full eval.  ECHO was ordered, not suggestive of heart failure.  Patient was asked to stop furosemide, she is reluctant stating it causes more tightness when she does stop it.    Review of Systems See HPI    Objective:   Physical Exam GEN: Alert & Oriented, No acute distress, morbidly obese. CV:  Regular Rate & Rhythm, no murmur Respiratory:  Normal work of breathing, CTAB Back:  No TTP.  Points to right SI joint as area of pain.  No pain on straight leg raise (except for right knee)  Patellar reflexes difficult elicit bilaterally. Strength 5/5.  Gait normal.  Does not appear uncomfortable. Ext: 2+ pitting edema lower legs bilaterally.          Assessment & Plan:

## 2011-12-11 NOTE — Assessment & Plan Note (Signed)
Asked patient to schedule with Dr. Raymondo Band for PFT per her primary MD [facility])  Has follow-up with PCP scheduled.

## 2011-12-11 NOTE — Telephone Encounter (Signed)
Patient was seen in office today by Dr. Earnest Bailey.  Patient states she will stop the furosemide and potassium.  Canceled appt for 12/16/11 and patient will see Dr. Aviva Signs on 12/26/11 as planned.  Gaylene Brooks, RN

## 2011-12-11 NOTE — Patient Instructions (Addendum)
Take extra strength tylenol arthritis for pain  I sent meloxicam to your pharmacy- dont take it with naproxen, aleve, ibuprofen  Ask front desk to schedule you for pulmonary function testing with Dr. Raymondo Band  Keep follow-up appointment with you primary doctor.

## 2011-12-11 NOTE — Assessment & Plan Note (Signed)
No red flags.  Less than one day.  Will change naproxen to meloxicam.  Advised adding on tylenol.  Avoiding narcotics as patient in recovering from substance abuse.  Advised weight loss and possibly physical therapy are important tools to discuss with PCP in the future if pain management continues to be problematic.

## 2011-12-16 ENCOUNTER — Ambulatory Visit: Payer: Medicaid Other | Admitting: Family Medicine

## 2011-12-20 ENCOUNTER — Ambulatory Visit (INDEPENDENT_AMBULATORY_CARE_PROVIDER_SITE_OTHER): Payer: Medicaid Other | Admitting: Pharmacist

## 2011-12-20 ENCOUNTER — Encounter: Payer: Self-pay | Admitting: Pharmacist

## 2011-12-20 VITALS — BP 133/91 | HR 71 | Ht 63.5 in | Wt 323.0 lb

## 2011-12-20 DIAGNOSIS — J439 Emphysema, unspecified: Secondary | ICD-10-CM | POA: Insufficient documentation

## 2011-12-20 DIAGNOSIS — J4489 Other specified chronic obstructive pulmonary disease: Secondary | ICD-10-CM

## 2011-12-20 DIAGNOSIS — R0602 Shortness of breath: Secondary | ICD-10-CM

## 2011-12-20 DIAGNOSIS — J449 Chronic obstructive pulmonary disease, unspecified: Secondary | ICD-10-CM

## 2011-12-20 MED ORDER — TIOTROPIUM BROMIDE MONOHYDRATE 18 MCG IN CAPS: 18.0000 ug | ORAL_CAPSULE | Freq: Every day | RESPIRATORY_TRACT | Status: DC

## 2011-12-20 NOTE — Patient Instructions (Signed)
It was great seeing you today.  You have moderate obstruction in your lungs that makes it difficult to breathe. Your breathing improved after the albuterol treatment, but we want you to be able to breath better while walking. We are starting you on Spiriva Handihaler- a new medication today that will help with your breathing The medication has been sent to [facility] pharmacy. Schedule a visit with Dr. Aviva Signs in one month

## 2011-12-20 NOTE — Assessment & Plan Note (Signed)
Spirometry evaluation reveals Moderate obstructive lung disease. Patient had shortness of breath upon walking a short distance. First FEV1 was 67, administered nebulized albuterol and repeated test. Second FEV1 was 77. There was some improvement but not significant enough to be considered reversible. Breathing was subjectively better after albuterol treatment. Treatment plan at this time was to initiate Spiriva Handihaler to help improve breathing. Instructions on how to use device was provided during visit.  Reviewed results of pulmonary function tests AND reviewed purpose proper use and potential adverse effects of tiotropium.  Encouraged continued abstinence from tobacco and othe substances of abuse.  Pt verbalized understanding of results.  Written pt instructions provided.  F/U Clinic visit in a month with Dr. Gerald Leitz.   Total time in face to face counseling 90 minutes.  Patient seen with Melynda Ripple, PharmD. Candidate. Marland Kitchen

## 2011-12-20 NOTE — Progress Notes (Signed)
Patient ID: Kiara Cooper, female   DOB: November 13, 1965, 46 y.o.   MRN: [id number] Reviewed and agree with Dr. Macky Lower management.

## 2011-12-20 NOTE — Assessment & Plan Note (Signed)
Spirometry evaluation reveals Moderate obstructive lung disease. Patient had shortness of breath upon walking a short distance. First FEV1 was 67, administered nebulized albuterol and repeated test. Second FEV1 was 77. There was some improvement but not significant enough to be considered reversible. Breathing was subjectively better after albuterol treatment. Treatment plan at this time was to initiate Spiriva Handihaler to help improve breathing. Instructions on how to use device was provided during visit.  Reviewed results of pulmonary function tests AND reviewed purpose proper use and potential adverse effects of tiotropium.  Encouraged continued abstinence from tobacco and othe substances of abuse.  Pt verbalized understanding of results.  Written pt instructions provided.  F/U Clinic visit in a month with Dr. Polito.   Total time in face to face counseling 90 minutes.  Patient seen with Amanda Tambe, PharmD. Candidate. .  

## 2011-12-20 NOTE — Progress Notes (Signed)
  Subjective:    Patient ID: Kiara Cooper, female    DOB: 05/18/1966, 46 y.o.   MRN: [id number]  HPI Patient was having shortness of breath upon arrival at the clinic today which is similar over the last few months.  She is morbidly obese, quit drinking and smoking January 2013. Had a 25 year smoking, substance abuse (cocaine), and drinking history. She also stopped taking her furosemide and potassium chloride tablets.      Review of Systems .See Documentation Flowsheet (discrete results - PFTs) for complete Spirometry results. Patient provided good effort while attempting spirometry.    Albuterol Neb  Lot#  [id number]    Exp. 06/2013      Objective:   Physical Exam Filed Vitals:   12/20/11 1027  BP: 133/91  Pulse: 71   Morbidly obese with a weight of 323lb, with edema in her lower extremities. Difficulty breathing       Assessment & Plan:  Spirometry evaluation reveals Moderate obstructive lung disease. Patient had shortness of breath upon walking a short distance. First FEV1 was 67, administered nebulized albuterol and repeated test. Second FEV1 was 77. There was some improvement but not significant enough to be considered reversible. Breathing was subjectively better after albuterol treatment. Treatment plan at this time was to initiate Spiriva Handihaler to help improve breathing. Instructions on how to use device was provided during visit.  Reviewed results of pulmonary function tests AND reviewed purpose proper use and potential adverse effects of tiotropium.  Encouraged continued abstinence from tobacco and othe substances of abuse.  Pt verbalized understanding of results.  Written pt instructions provided.  F/U Clinic visit in a month with Dr. Gerald Leitz.   Total time in face to face counseling 90 minutes.  Patient seen with Melynda Ripple, PharmD. Candidate. Marland Kitchen

## 2011-12-26 ENCOUNTER — Encounter: Payer: Self-pay | Admitting: Family Medicine

## 2011-12-26 ENCOUNTER — Ambulatory Visit (INDEPENDENT_AMBULATORY_CARE_PROVIDER_SITE_OTHER): Payer: Medicaid Other | Admitting: Family Medicine

## 2011-12-26 VITALS — BP 142/85 | HR 68 | Temp 97.9°F | Ht 63.5 in | Wt 321.6 lb

## 2011-12-26 DIAGNOSIS — G478 Other sleep disorders: Secondary | ICD-10-CM

## 2011-12-26 DIAGNOSIS — M545 Low back pain, unspecified: Secondary | ICD-10-CM

## 2011-12-26 DIAGNOSIS — E669 Obesity, unspecified: Secondary | ICD-10-CM

## 2011-12-26 DIAGNOSIS — G4730 Sleep apnea, unspecified: Secondary | ICD-10-CM

## 2011-12-26 DIAGNOSIS — J4489 Other specified chronic obstructive pulmonary disease: Secondary | ICD-10-CM

## 2011-12-26 DIAGNOSIS — R0602 Shortness of breath: Secondary | ICD-10-CM

## 2011-12-26 DIAGNOSIS — I10 Essential (primary) hypertension: Secondary | ICD-10-CM

## 2011-12-26 DIAGNOSIS — J449 Chronic obstructive pulmonary disease, unspecified: Secondary | ICD-10-CM

## 2011-12-26 MED ORDER — HYDROCHLOROTHIAZIDE 25 MG PO TABS: 25.0000 mg | ORAL_TABLET | Freq: Every day | ORAL | Status: DC

## 2011-12-26 MED ORDER — MELOXICAM 15 MG PO TABS: 15.0000 mg | ORAL_TABLET | Freq: Every day | ORAL | Status: DC

## 2011-12-26 NOTE — Assessment & Plan Note (Addendum)
BMI 56.08 morbid obesity. Described her eating habits as "she is always hungry" A1C 6.0. TSH WNL. Interested in weight loss. Plan: Recommended nutrition consult for weight management.

## 2011-12-27 NOTE — Patient Instructions (Addendum)
Serving Sizes What we call a serving size today is larger than it was in the past. A 1950s fast-food burger contained little more than 1 oz of meat, and a soft drink was 8 oz (1 cup). Today, a "quarter pounder" burger is at least 4 times that amount, and a 32 or 64 oz drink is not uncommon. A possible guide for eating when trying to lose weight is to eat about half as much as you normally do. Some estimates of serving sizes are:  1 Dairy serving:Individual container of yogurt (8 oz) or piece of cheese the size of your thumb (1 oz).   1 Grain serving: 1 slice of bread or  cup pasta.   1 Meat serving: The size of a deck of cards (3 oz).   1 Fruit serving: cup canned fruit or 1 medium fruit.   1 Vegetable serving:  cup of cooked or canned vegetables.   1 Fat serving:The size of 4 stacked dimes.  Experts suggest spending 1 or 2 days measuring food portions you commonly eat. This will give you better practice at estimating serving sizes, and will also show whether you are eating an appropriate amount of food to meet your weight goals. If you find that you are eating more than you thought, try measuring your food for a few days so you can "reprogram" yourself to learn what makes a healthy portion for you. SUGGESTIONS FOR CONTROL  In restaurants, share entrees, or ask the waiter to put half the entre in a box or bag before you even touch it.   Order lunch-sized portions. Many restaurants serve 4 to 6 oz of meat at lunch, compared with 8 to 10 oz at dinner.   Split dessert or skip it all together. Have a piece of fruit when you get home.   At home, use smaller plates and bowls. It will look as if you are eating more.   Plate your food in the kitchen rather than serving it "family style" at the table.   Wait 20 to 30 minutes before taking seconds. This is how long it takes your brain to recognize that you are full.   Check food labels for serving sizes. Eat 1 serving only.   Use measuring  cups and spoons to see proper serving sizes.   Buy smaller packages of candy, popcorn, and snacks.   Avoid eating directly out of the bag or carton.   While eating half as much, exercise twice as much. Park further away from the mall, take the stairs instead of the escalator, and walk around your block.  Losing weight is a slow, difficult process. It takes long-lasting lifestyle changes. You can make gradual changes over time so they become habits. Look to friends and family to support the healthy changes you are making. Avoid fad diets since they are often only temporary weight loss solutions. Document Released: 04/20/2003 Document Revised: 07/11/2011 Document Reviewed: 05/30/2009 Exit[employer] Patient Information 2012 [employer]. 

## 2011-12-31 DIAGNOSIS — G4730 Sleep apnea, unspecified: Secondary | ICD-10-CM

## 2011-12-31 HISTORY — DX: Sleep apnea, unspecified: G47.30

## 2011-12-31 NOTE — Assessment & Plan Note (Signed)
On Amlodipine-Valsartan 10-320 and still elevated. Mild ankle edema more likely venostasis. Plan: Continue medication and will add Hydrochlorothiazide 25 mg  Pt mention she has financial difficulties to come for frequent follow ups. We recommended to keep a log of BP and if still elevated above 140/90 should come and see [place]. If controlled will f/u in 3-4 months.

## 2011-12-31 NOTE — Assessment & Plan Note (Signed)
On

## 2011-12-31 NOTE — Assessment & Plan Note (Addendum)
Moderated COPD on treatment that seems to alleviate symptoms. Pt has discontinued furosemide and Kcl since ECHO EF was 60-65%. Lower extremity edema is most likely to be due to venostasis.   Plan: Continue spiriva.  Continue smoking abstinence. Positive reinforcement given. Reevaluate in 3-4 months.

## 2011-12-31 NOTE — Assessment & Plan Note (Addendum)
Please refer to SOB problem.

## 2011-12-31 NOTE — Progress Notes (Signed)
  Subjective:    Patient ID: Kiara Cooper, female    DOB: [birth year], 46 y.o.   MRN: [id number]  HPI Pt comes today for a f/u of her COPD recently diagnosed. 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   1. COPD: pt taking Spiriva inhaler feels better of her SOB. No side effect of new medication. 2. HTN: BP today still elevated, pt reports compliant with Amlodipine-Valsartan(10/320). Continue to complaint of mild to moderated lower extremity edema.  3. Obesity: morbidly obese and interested in weight management.   4. Sleep problems: other people has told her that she snores at sleep. She has noticed waking up at night gasping for air lately. No orthopnea. She also mentions her sleep is not restoring and she continues to feel sleepy and tired throughout the day.   Review of Systems  Constitutional: Positive for fatigue. Negative for chills and diaphoresis.  HENT: Negative.   Eyes: Negative.   Respiratory: Positive for shortness of breath. Negative for chest tightness and wheezing.   Cardiovascular: Positive for leg swelling. Negative for chest pain and palpitations.  Gastrointestinal: Negative.   Musculoskeletal: Positive for arthralgias.  Skin: Negative.   Neurological: Negative.   Psychiatric/Behavioral: Positive for sleep disturbance. The patient is not nervous/anxious.        Objective:   Physical Exam  Constitutional: She is oriented to person, place, and time. No distress.  HENT:  Mouth/Throat: Oropharynx is clear and moist.  Eyes: Conjunctivae and EOM are normal. Pupils are equal, round, and reactive to light.  Neck: Normal range of motion. Neck supple. No JVD present. No thyromegaly present.  Cardiovascular: Normal rate, regular rhythm, normal heart sounds and intact distal pulses.  Exam reveals no gallop and no friction rub.   No murmur heard. Pulmonary/Chest: Effort normal and breath sounds normal. No respiratory distress.  She has no wheezes. She has no rales.  Abdominal: Soft. Bowel sounds are normal. There is no tenderness.  Musculoskeletal: Normal range of motion. She exhibits edema.       2+ edema up to ankles bilaterally. No joint erythema. Mild dry skin.   Lymphadenopathy:    She has no cervical adenopathy.  Neurological: She is alert and oriented to person, place, and time. She has normal reflexes.  Psychiatric: She has a normal mood and affect. Her behavior is normal.       Assessment & Plan:

## 2012-01-14 ENCOUNTER — Ambulatory Visit: Payer: Medicaid Other

## 2012-01-14 ENCOUNTER — Encounter: Payer: Self-pay | Admitting: Family Medicine

## 2012-01-14 ENCOUNTER — Ambulatory Visit (INDEPENDENT_AMBULATORY_CARE_PROVIDER_SITE_OTHER): Payer: Medicaid Other | Admitting: Family Medicine

## 2012-01-14 VITALS — BP 137/73 | HR 82 | Temp 99.2°F | Ht 65.0 in | Wt 318.0 lb

## 2012-01-14 DIAGNOSIS — I10 Essential (primary) hypertension: Secondary | ICD-10-CM

## 2012-01-14 DIAGNOSIS — M542 Cervicalgia: Secondary | ICD-10-CM

## 2012-01-14 LAB — CBC WITH DIFFERENTIAL/PLATELET
Basophils Absolute: 0 10*3/uL (ref 0.0–0.1)
Basophils Relative: 0 % (ref 0–1)
Eosinophils Absolute: 0.6 10*3/uL (ref 0.0–0.7)
MCH: 26.7 pg (ref 26.0–34.0)
MCHC: 32.2 g/dL (ref 30.0–36.0)
Neutrophils Relative %: 60 % (ref 43–77)
Platelets: 340 10*3/uL (ref 150–400)
RBC: 4.45 MIL/uL (ref 3.87–5.11)
RDW: 13.8 % (ref 11.5–15.5)

## 2012-01-14 MED ORDER — PENICILLIN V POTASSIUM 500 MG PO TABS: 500.0000 mg | ORAL_TABLET | Freq: Three times a day (TID) | ORAL | Status: AC

## 2012-01-14 MED ORDER — MELOXICAM 15 MG PO TABS: 15.0000 mg | ORAL_TABLET | Freq: Every day | ORAL | Status: DC

## 2012-01-14 MED ORDER — CYCLOBENZAPRINE HCL 10 MG PO TABS: 10.0000 mg | ORAL_TABLET | Freq: Three times a day (TID) | ORAL | Status: DC | PRN

## 2012-01-14 NOTE — Progress Notes (Signed)
  Subjective:    Patient ID: Kiara Cooper, female    DOB: 04-14-1966, 46 y.o.   MRN: [id number]  HPI R neck pain - 2 days ago awoke with severe pain to touch in right submandibular and anterolateral neck, increased with rotating and extending neck. Numbness right 4th finger, but no pain in right arm. The pain radiates up into her right mandible, ear, and temple.Has noted some discomfort in the TMJ area. No dysphagia. 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 Has been out of her Meloxicam this past week.   Dental - teeth were cleaned a couple weeks ago and the dentist prescribed an antibiotic, but didn't mention an abscess. No sensitive teeth.   Swallowing - no dysphagia or history of food sticking in her throat.  Anxiety - taking Hydroxyzine Review of Systems     Objective:   Physical Exam  Constitutional: She is oriented to person, place, and time.       Generalized obesity  HENT:  Right Ear: External ear normal.  Left Ear: External ear normal.  Nose: Nose normal.  Mouth/Throat: Oropharynx is clear and moist. No oropharyngeal exudate.       Good dentition without apparent dental abscess  Neurological: She is alert and oriented to person, place, and time.          Assessment & Plan:

## 2012-01-14 NOTE — Patient Instructions (Signed)
You will be taking Penicillin for the possibility of a dental abscess. If you develop a sensitive tooth, see your dentist  For the neck muscle spasm and possible TMJ syndrome, the Meloxicam and Cyclobenzaprine should help.   Return if you develop fever, chills or difficulty swallowing   Schedule an appointment with Dr Aviva Signs in a couple weeks for follow up.

## 2012-01-14 NOTE — Assessment & Plan Note (Signed)
well controlled  

## 2012-01-14 NOTE — Assessment & Plan Note (Signed)
Possible adenitis from dental infection. Will cover with penicillin. Could also be element of TMJ causing neck spasm. Restart Meloxicam and short course of Cyclobenzaprine. Dental referral if dental or TMJ becomes more evident. Doubt cervical radiculopathy would cause the facial symptoms

## 2012-01-20 ENCOUNTER — Ambulatory Visit: Payer: Medicaid Other | Admitting: Family Medicine

## 2012-02-14 ENCOUNTER — Ambulatory Visit (HOSPITAL_BASED_OUTPATIENT_CLINIC_OR_DEPARTMENT_OTHER): Payer: Medicaid Other | Attending: Family Medicine | Admitting: Radiology

## 2012-02-14 DIAGNOSIS — R0602 Shortness of breath: Secondary | ICD-10-CM

## 2012-02-14 DIAGNOSIS — G4733 Obstructive sleep apnea (adult) (pediatric): Secondary | ICD-10-CM | POA: Insufficient documentation

## 2012-02-14 DIAGNOSIS — E669 Obesity, unspecified: Secondary | ICD-10-CM

## 2012-02-17 ENCOUNTER — Other Ambulatory Visit: Payer: Self-pay | Admitting: Family Medicine

## 2012-02-23 DIAGNOSIS — G4733 Obstructive sleep apnea (adult) (pediatric): Secondary | ICD-10-CM

## 2012-02-24 NOTE — Procedures (Signed)
NAME:  Kiara Cooper, Kiara Cooper                ACCOUNT NO.:  [id number]  MEDICAL RECORD NO.:  [id number]          PATIENT TYPE:  OUT  LOCATION:  SLEEP CENTER                 FACILITY:  [facility]  PHYSICIAN:  Adhvik Canady D. Maple Hudson, MD, FCCP, FACPDATE OF BIRTH:  10[birth year]  DATE OF STUDY:  02/14/2012                           NOCTURNAL POLYSOMNOGRAM  REFERRING PHYSICIAN:  Paula Compton, MD  INDICATION FOR STUDY:  Hypersomnia with sleep apnea.  EPWORTH SLEEPINESS SCORE:  23/24.  BMI 50, weight 300 pounds, height 65 inches, neck 17.5 inches.  MEDICATIONS:  Home medications are charted and reviewed.  SLEEP ARCHITECTURE:  Total sleep time 309.5 minutes with sleep efficiency 74.4%.  Stage I was 15.3%, stage II 55.7%, stage III absent, REM 28.9% of total sleep time.  Sleep latency 26.5 minutes, REM latency 142.5 minutes, awake after sleep onset 81 minute, arousal index 47.7. Bedtime medication:  None.  RESPIRATORY DATA:  Apnea hypotony index (AHI) 47.1 per hour.  A total of 243 events were scored including 104 obstructive apneas, 5 central apneas, 14 mixed apneas, 120 hypopneas.  Events were associated with nonsupine sleep position and REM.  REM AHI 65 per hour.  This is a diagnostic NPSG protocol as ordered, CPAP was not done.  OXYGEN DATA:  Moderate snoring with oxygen desaturation to a nadir of 85% and mean oxygen saturation through the study of 94.4% on room air.  CARDIAC DATA:  Normal sinus rhythm.  MOVEMENT-PARASOMNIA:  A total of 16 limb jerks were counted, of which 7 were associated with arousal or awakening for periodic limb movement with arousal index of 1.4 per hour.  Bathroom x2.  IMPRESSIONS-RECOMMENDATIONS: 1. Severe obstructive sleep apnea/hypopnea syndrome, AHI 47.1 per hour     with nonsupine and REM related events.  REM AHI 65 per hour.     Moderate snoring with oxygen desaturation to a nadir of 85% and     mean oxygen saturation through the study of 94.4% on room air. 2. This is a  diagnostic NPSG protocol and CPAP titration was not done.     If clinically appropriate, she can return for dedicated CPAP     titration study.     Ulrick Methot D. Maple Hudson, MD, [facility], FACP Diplomate, American Board of Sleep Medicine    CDY/MEDQ  D:  02/23/2012 10:28:09  T:  02/24/2012 01:07:03  Job:  [id number]

## 2012-03-04 ENCOUNTER — Ambulatory Visit (INDEPENDENT_AMBULATORY_CARE_PROVIDER_SITE_OTHER): Payer: Medicaid Other | Admitting: Family Medicine

## 2012-03-04 ENCOUNTER — Encounter: Payer: Self-pay | Admitting: Family Medicine

## 2012-03-04 VITALS — BP 133/82 | HR 87 | Temp 97.9°F | Wt 327.0 lb

## 2012-03-04 DIAGNOSIS — M25562 Pain in left knee: Secondary | ICD-10-CM

## 2012-03-04 DIAGNOSIS — G4733 Obstructive sleep apnea (adult) (pediatric): Secondary | ICD-10-CM

## 2012-03-04 DIAGNOSIS — M25569 Pain in unspecified knee: Secondary | ICD-10-CM

## 2012-03-04 DIAGNOSIS — G4730 Sleep apnea, unspecified: Secondary | ICD-10-CM

## 2012-03-04 NOTE — Patient Instructions (Addendum)
You have been diagnosed with sleep apnea. The next step is that you will need a CPAP machine during sleep. This machine has to be adjusted to your personal settings. The order has been placed. You will be called with more information regarding this.  An order for lefty knee also has been placed. After xray is preformed please schedule a follow up appointment with me to discuss treatment options.  A prescription for a cane has also been provided during this visit.

## 2012-03-09 ENCOUNTER — Other Ambulatory Visit: Payer: Self-pay | Admitting: Family Medicine

## 2012-03-09 ENCOUNTER — Encounter: Payer: Self-pay | Admitting: Family Medicine

## 2012-03-09 DIAGNOSIS — G4733 Obstructive sleep apnea (adult) (pediatric): Secondary | ICD-10-CM

## 2012-03-09 DIAGNOSIS — M25562 Pain in left knee: Secondary | ICD-10-CM | POA: Insufficient documentation

## 2012-03-09 NOTE — Assessment & Plan Note (Signed)
Plan: Pt informed of diagnosis and details of her condition.  CPAP titration per [facility]

## 2012-03-09 NOTE — Progress Notes (Signed)
Order for CPAP to home health. Dr Maple Hudson recommended following settings: Suggest ordering CPAP autotitration, 5- 20 cwp, x 7 days, mask of choice, humidifier and supplies, for dx OSA. The down load will come back indicating what pressure the machine used up to 90% of the time. That can be your prescribed pressure setting long term.

## 2012-03-09 NOTE — Progress Notes (Signed)
  Subjective:    Patient ID: Kiara Cooper, female    DOB: [birth year], 46 y.o.   MRN: [id number]  HPI Pt that comes today for follow up sleep studies and left knee pain.  1. Sleep studies diagnostic for Severe obstructive sleep apnea/hypopnea syndrome.  2. Leftk nee pain: chronic condition that has exacerbated in the past days. Pt has taken only ibuprofen with mild relieve of symptoms. She reports steroid shots in the past x1 that helped her. No hx of trauma. She would like to use a cane since this will help to stabilize gait while in mild to moderated pain. Pt can not take opioids for pain due to her hx of prior drug abuse currently in a rehabilitation program [facility]). Pt is obese and was interested in losing weight but missed appointment scheduled with our dietitian.  Review of Systems Per HPI     Objective:   Physical Exam  Constitutional: She is oriented to person, place, and time. No distress.  HENT:  Head: Normocephalic and atraumatic.  Neck: Normal range of motion. Neck supple. No JVD present. No thyromegaly present.  Cardiovascular: Normal rate, regular rhythm and normal heart sounds.   No murmur heard. Pulmonary/Chest: Effort normal and breath sounds normal.  Abdominal: Soft. Bowel sounds are normal.  Musculoskeletal: She exhibits no edema.       Left Knee: difficult to asses for effusion die to pt body habitus. Crepitus with extension and flexion. Normal ROM passive and active. Tenderness to palpation on lateral and medial aspect of joint line. No tenderness on patellar tendon. No joint laxity.   Neurological: She is alert and oriented to person, place, and time. She has normal reflexes.          Assessment & Plan:

## 2012-03-09 NOTE — Assessment & Plan Note (Addendum)
Most likely osteoarthritis. No history of trauma. Physical exam negative for ligament injury. Concerns for meniscal tear/arthitic changes. Pt age does not favor this Dx  but she is morbidly obese.  Plan: Xray of Knee to evaluate with possible steroid knee injection. Acetaminophen for pain Prescription for straight cane given.

## 2012-03-26 ENCOUNTER — Ambulatory Visit: Payer: Medicaid Other | Admitting: Family Medicine

## 2012-03-30 ENCOUNTER — Ambulatory Visit: Payer: Medicaid Other | Admitting: Family Medicine

## 2012-04-20 ENCOUNTER — Emergency Department (HOSPITAL_COMMUNITY)
Admission: EM | Admit: 2012-04-20 | Discharge: 2012-04-21 | Disposition: A | Discharge status: Home / Self Care | Payer: Medicaid Other | Attending: Emergency Medicine | Admitting: Emergency Medicine

## 2012-04-20 ENCOUNTER — Encounter (HOSPITAL_COMMUNITY): Payer: Self-pay

## 2012-04-20 ENCOUNTER — Emergency Department (HOSPITAL_COMMUNITY): Payer: Medicaid Other

## 2012-04-20 DIAGNOSIS — Z7982 Long term (current) use of aspirin: Secondary | ICD-10-CM | POA: Insufficient documentation

## 2012-04-20 DIAGNOSIS — Z8739 Personal history of other diseases of the musculoskeletal system and connective tissue: Secondary | ICD-10-CM | POA: Insufficient documentation

## 2012-04-20 DIAGNOSIS — J441 Chronic obstructive pulmonary disease with (acute) exacerbation: Secondary | ICD-10-CM

## 2012-04-20 HISTORY — DX: Chronic obstructive pulmonary disease, unspecified: J44.9

## 2012-04-20 LAB — BASIC METABOLIC PANEL
BUN: 15 mg/dL (ref 6–23)
Calcium: 9.3 mg/dL (ref 8.4–10.5)
Creatinine, Ser: 1.07 mg/dL (ref 0.50–1.10)
GFR calc Af Amer: 70 mL/min — ABNORMAL LOW (ref >90)

## 2012-04-20 LAB — POCT I-STAT TROPONIN I: Troponin i, poc: 0 ng/mL (ref 0.00–0.08)

## 2012-04-20 LAB — CBC
HCT: 35.6 % — ABNORMAL LOW (ref 36.0–46.0)
MCH: 27.4 pg (ref 26.0–34.0)
MCV: 85.6 fL (ref 78.0–100.0)
Platelets: 314 10*3/uL (ref 150–400)
RDW: 13.8 % (ref 11.5–15.5)

## 2012-04-20 MED ORDER — ALBUTEROL SULFATE (5 MG/ML) 0.5% IN NEBU
2.5000 mg | INHALATION_SOLUTION | RESPIRATORY_TRACT | Status: DC
  Administered 2012-04-20: 2.5 mg via RESPIRATORY_TRACT
  Filled 2012-04-20: qty 0.5

## 2012-04-20 MED ORDER — IPRATROPIUM BROMIDE 0.02 % IN SOLN
0.5000 mg | RESPIRATORY_TRACT | Status: DC
  Administered 2012-04-20: 0.5 mg via RESPIRATORY_TRACT
  Filled 2012-04-20: qty 2.5

## 2012-04-20 MED ORDER — AEROCHAMBER Z-STAT PLUS/MEDIUM MISC: 1.0000 | Freq: Once | Status: DC

## 2012-04-20 MED ORDER — PREDNISONE 20 MG PO TABS
60.0000 mg | ORAL_TABLET | Freq: Once | ORAL | Status: AC
  Administered 2012-04-20: 60 mg via ORAL
  Filled 2012-04-20: qty 3

## 2012-04-20 MED ORDER — IPRATROPIUM BROMIDE 0.02 % IN SOLN: 0.5000 mg | RESPIRATORY_TRACT | Status: DC

## 2012-04-20 MED ORDER — ALBUTEROL (5 MG/ML) CONTINUOUS INHALATION SOLN
10.0000 mg/h | INHALATION_SOLUTION | Freq: Once | RESPIRATORY_TRACT | Status: AC
  Administered 2012-04-20: 10 mg/h via RESPIRATORY_TRACT

## 2012-04-20 MED ORDER — ALBUTEROL SULFATE (5 MG/ML) 0.5% IN NEBU
5.0000 mg | INHALATION_SOLUTION | Freq: Once | RESPIRATORY_TRACT | Status: AC
  Administered 2012-04-20: 5 mg via RESPIRATORY_TRACT
  Filled 2012-04-20: qty 1

## 2012-04-20 MED ORDER — ALBUTEROL SULFATE (5 MG/ML) 0.5% IN NEBU: 2.5000 mg | INHALATION_SOLUTION | RESPIRATORY_TRACT | Status: DC

## 2012-04-20 MED ORDER — ALBUTEROL SULFATE HFA 108 (90 BASE) MCG/ACT IN AERS
2.0000 | INHALATION_SPRAY | RESPIRATORY_TRACT | Status: DC | PRN
  Administered 2012-04-20: 2 via RESPIRATORY_TRACT
  Filled 2012-04-20: qty 6.7

## 2012-04-20 MED ORDER — PREDNISONE 10 MG PO TABS: 60.0000 mg | ORAL_TABLET | Freq: Every day | ORAL | Status: DC

## 2012-04-20 MED ORDER — AZITHROMYCIN 250 MG PO TABS: 500.0000 mg | ORAL_TABLET | Freq: Every day | ORAL | Status: DC

## 2012-04-20 NOTE — ED Notes (Signed)
EKG Shown to Dr. Alto Denver

## 2012-04-20 NOTE — ED Notes (Signed)
Ambulated pt to the restroom and back. Pts oxygen level remained above 95 however pt felt short of breath and was wheezing at the return to her bed.

## 2012-04-20 NOTE — ED Notes (Signed)
Bonk, MD at bedside. 

## 2012-04-20 NOTE — ED Notes (Signed)
Hx of COPD, on spriva, started to be SOB 2 days ago worsening, audible wheezing breath sound noted initially, diminished breath sound in general during ausculation. Pt tearful, alert, oriented, sat 97% on Room air. BP 142/77

## 2012-04-20 NOTE — ED Notes (Signed)
MD at bedside. 

## 2012-04-20 NOTE — ED Notes (Signed)
Also complained of chest tightness and lung pain worsen when taking deep breath, pain radiate to the right shoulder with no nausea.

## 2012-04-20 NOTE — ED Provider Notes (Signed)
History     CSN: [id number]  Arrival date & time 04/20/12  1537   First MD Initiated Contact with Patient 04/20/12 1835      Chief Complaint  Patient presents with  . Shortness of Breath    (Consider location/radiation/quality/duration/timing/severity/associated sxs/prior treatment) HPI Kiara Cooper is a 46 y.o. female presenting to the emergency department with 3 days history of chest pain which is described as a tightness across the center of the chest in front, and does occasionally spread to the right shoulder blade, this is associated dyspnea on exertion, it is noted to be at 10/10. This is only present when she  Is exerting herself and she gets short of breath, then she notices the chest tightness. Patient does have a recent diagnosis of COPD and has been started on Spiriva. She does admit to audible wheezing. She says the Spiriva it has not helped. She's also taking CPAP at night. Denies any dizziness, lightheadedness, pressure type chest pain, productive cough or fever.  She has a smoking history of one to 2 packs daily for 30 years.     Past Medical History  Diagnosis Date  . Hypertension   . Arthritis   . Edema   . 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 No orthopnea. Epworth Sleepiness Scale is 10.  Plan: Polysomnography studies.    Marland Kitchen COPD (chronic obstructive pulmonary disease)     Past Surgical History  Procedure Date  . Hernia repair   . Cesarean section w/btl   . Tubal ligation     No family history on file.  History  Substance Use Topics  . Smoking status: Former Games developer  . Smokeless tobacco: Never Used  . 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   Allergies  Review of patient's allergies indicates no known allergies.  Home Medications   Current Outpatient Rx  Name Route Sig Dispense Refill  . AMLODIPINE BESYLATE-VALSARTAN 10-320 MG PO TABS Oral Take 1 tablet by mouth daily.    . ASPIRIN 81 MG PO TABS Oral Take 1 tablet (81 mg total) by mouth daily. 30 tablet 11  . HYDROCHLOROTHIAZIDE 25 MG PO TABS Oral Take 1 tablet (25 mg total) by mouth daily. 90 tablet 3  . MELOXICAM 15 MG PO TABS Oral Take 1 tablet (15 mg total) by mouth daily. 30 tablet 3  . TIOTROPIUM BROMIDE MONOHYDRATE 18 MCG IN CAPS Inhalation Place 1 capsule (18 mcg total) into inhaler and inhale daily. 30 capsule 12    BP 135/78  Pulse 80  Temp 98.7 F (37.1 C) (Oral)  Resp 24  SpO2 100%  LMP 04/03/2012  Physical Exam  Nursing notes reviewed.  Electronic medical record reviewed. VITAL SIGNS:   Filed Vitals:   04/20/12 2115 04/20/12 2129 04/20/12 2304 04/20/12 2348  BP:  121/57  127/48  Pulse: 78   90  Temp:    98 F (36.7 C)  TempSrc:    Oral  Resp: 18   19  SpO2: 100%  98% 96%  CONSTITUTIONAL: Awake, oriented, appears non-toxic HENT: Atraumatic, normocephalic, oral mucosa pink and moist, airway patent. Nares patent without drainage. External ears normal. EYES: Conjunctiva clear, EOMI, PERRLA NECK: Trachea midline, non-tender, supple CARDIOVASCULAR: Normal heart rate, Normal rhythm, No murmurs, rubs, gallops PULMONARY/CHEST: Decreased breath sounds bilaterally, poor air movement, wheezing heard in the right side more than left side.. Non-tender. ABDOMINAL: Non-distended, morbidly obese, soft, non-tender - no rebound or guarding.  BS normal. NEUROLOGIC: Non-focal, moving all four extremities, no gross sensory or motor deficits. EXTREMITIES: No clubbing, cyanosis, or edema SKIN: Warm, Dry, No erythema, No rash  ED Course  Procedures (including critical care time)  Labs Reviewed  CBC - Abnormal; Notable for the following:      Hemoglobin 11.4 (*)     HCT 35.6 (*)     All other components within normal limits  BASIC METABOLIC PANEL - Abnormal; Notable for the following:    Glucose, Bld 130 (*)     GFR calc non Af Amer 61 (*)     GFR calc Af Amer 70 (*)     All other components within normal limits  POCT I-STAT TROPONIN I   Dg Chest Port 1 View  04/20/2012  *RADIOLOGY REPORT*  Clinical Data: Short of breath  PORTABLE CHEST - 1 VIEW  Comparison: 11/07/2011  Findings: Cardiac enlargement with mild vascular congestion. Negative for edema or effusion.  Negative for pneumonia.  IMPRESSION: Pulmonary vascular congestion without edema.   Original Report Authenticated By: Camelia Phenes, M.D.      No diagnosis found.    MDM  Kiara Cooper is a 46 y.o. female presented with COPD, long smoking history, also admits some drug abuse history as well. Patient is not moving very much air, although she does not have a silent chest. Laboratory workup was started and initiated on the patient which is unremarkable. Will place the patient on continuous nebs for an hour to achieve bronchodilation.  Chest x-ray shows some pulmonary vascular congestion without any edema.  Patient is feeling much better after 10 mg of albuterol over an hour she is able to ambulate in the hallway and her oxygen saturation does not go below 94%. If she rests briefly her oxygen saturation quickly recovers on room air. She's been given prednisone in the emergency department, she'll be given a short burst of prednisone as well as a Z-Pak and an albuterol inhaler with spacer to go home with.  I explained the diagnosis and have given explicit precautions to return to the ER including shortness of breath that does not resolve with medicine, different chest pain or any other new or worsening symptoms. The patient understands and accepts the medical plan as it's been dictated and I have answered their questions. Discharge instructions concerning home care and  prescriptions have been given.  The patient is STABLE and is discharged to home in good condition.       Kiara Skene, MD 04/21/12 [phone]

## 2012-04-22 ENCOUNTER — Encounter: Payer: Self-pay | Admitting: Family Medicine

## 2012-04-22 ENCOUNTER — Ambulatory Visit (INDEPENDENT_AMBULATORY_CARE_PROVIDER_SITE_OTHER): Payer: Medicaid Other | Admitting: Family Medicine

## 2012-04-22 VITALS — BP 136/79 | HR 89 | Temp 98.4°F | Ht 65.0 in | Wt 331.0 lb

## 2012-04-22 DIAGNOSIS — J449 Chronic obstructive pulmonary disease, unspecified: Secondary | ICD-10-CM

## 2012-04-22 MED ORDER — IPRATROPIUM BROMIDE 0.02 % IN SOLN
0.5000 mg | Freq: Once | RESPIRATORY_TRACT | Status: AC
  Administered 2012-04-22: 0.5 mg via RESPIRATORY_TRACT

## 2012-04-22 MED ORDER — ALBUTEROL SULFATE (2.5 MG/3ML) 0.083% IN NEBU
2.5000 mg | INHALATION_SOLUTION | Freq: Once | RESPIRATORY_TRACT | Status: AC
  Administered 2012-04-22: 2.5 mg via RESPIRATORY_TRACT

## 2012-04-22 MED ORDER — ALBUTEROL SULFATE HFA 108 (90 BASE) MCG/ACT IN AERS: 2.0000 | INHALATION_SPRAY | Freq: Four times a day (QID) | RESPIRATORY_TRACT | Status: DC | PRN

## 2012-04-22 MED ORDER — PREDNISONE 20 MG PO TABS: 40.0000 mg | ORAL_TABLET | Freq: Every day | ORAL | Status: DC

## 2012-04-22 NOTE — Progress Notes (Signed)
  Subjective:    Patient ID: Kiara Cooper, female    DOB: May 05, 1966, 46 y.o.   MRN: [id number]  HPI Pt that comes today with the complaint of SOB for 2 days. She was seen yesterday at ED and given albuterol, prednisone, azithromycin and sent home. We see her today with audible wheezing and SOB. O2 Saturation were in upper 95's. One dose albuterol and pt recover from wheezing almost completely. She has not been taking her albuterol ordered yesterday due to restriction on [facility] where she lives. She is requesting letter to ask for permission to use albuterol inhaler. Review of Systems Per HPI. Denies chest pain, fever, chills.  Positive for productive cough with whitish sputum.     Objective:   Physical Exam Gen:  NAD HEENT: Moist mucous membranes. No JVD CV: Regular rate and rhythm, no murmurs rubs or gallops PULM: Diminishes breath sounds bilaterally. No wheezes/rales/rhonchi after albuterol neb dose. ABD: Soft, non tender, non distended, normal bowel sounds EXT: No edema Neuro: Alert and oriented x3. No focalization      Assessment & Plan:

## 2012-04-22 NOTE — Assessment & Plan Note (Signed)
COPD exacerbation: Negative CXR done yesterday. Normal O2 sats. Pt resolved wheezing in office today with one dose albuterol. We think the major issue was the difficulty for pt to take her treatment. Plan: Letter given to pt with indications for medication administration. Albuterol Q4 today and space to Q6 tomorrow, then PRN. Prednisone 40 mg daily for 5 days. Continue azithromycin.  F/u if worsening symptoms. Discussed red flags and indication for emergent assessment. Pt voiced understanding.

## 2012-04-22 NOTE — Patient Instructions (Addendum)
You have an exacerbation of  COPD. Continue taking prednisone now at 40 mg daily for 5 days. Continue taking bacitracin as prescribed by ED Dr. Albuterol. Take 2 puffs every 4 hours today and space it to every 6 hours tomorrow. Then as needed for shortness of breath or wheezing.

## 2012-04-27 ENCOUNTER — Telehealth: Payer: Self-pay | Admitting: Family Medicine

## 2012-04-27 NOTE — Telephone Encounter (Signed)
After hanging up, called patient and explained that I am very concerned about her regarding pain and breathing concerns and feel that she needs to be seen today. She will try and see is she can get someone to bring her. She will check and call back.Marland Kitchen

## 2012-04-27 NOTE — Telephone Encounter (Signed)
Patient complains with pain in upper back. esp with exertion.  Has SOB with exertion and sometimes has pain in  center of chest when she exerts herself. States she is feeling about the same as last week when she was here. Was having similar symptoms.  Advised that she really needs to come in today to be seen but she cannot come today she states. Asks for appointment tomorrow afternoon and this is scheduled. Advised if worsening needs to go to ED.

## 2012-04-27 NOTE — Telephone Encounter (Signed)
Pt needs to ask question about pain in her back

## 2012-04-28 ENCOUNTER — Ambulatory Visit (INDEPENDENT_AMBULATORY_CARE_PROVIDER_SITE_OTHER): Payer: Medicaid Other | Admitting: Family Medicine

## 2012-04-28 ENCOUNTER — Encounter: Payer: Self-pay | Admitting: Family Medicine

## 2012-04-28 ENCOUNTER — Ambulatory Visit (HOSPITAL_COMMUNITY)
Admission: RE | Admit: 2012-04-28 | Discharge: 2012-04-28 | Disposition: A | Discharge status: Home / Self Care | Payer: Medicaid Other | Source: Ambulatory Visit | Attending: Family Medicine | Admitting: Family Medicine

## 2012-04-28 ENCOUNTER — Ambulatory Visit (HOSPITAL_COMMUNITY): Admission: RE | Admit: 2012-04-28 | Payer: Medicaid Other | Source: Ambulatory Visit

## 2012-04-28 VITALS — BP 113/65 | HR 84 | Temp 97.6°F | Ht 65.0 in | Wt 331.0 lb

## 2012-04-28 DIAGNOSIS — J449 Chronic obstructive pulmonary disease, unspecified: Secondary | ICD-10-CM

## 2012-04-28 DIAGNOSIS — R0602 Shortness of breath: Secondary | ICD-10-CM

## 2012-04-28 DIAGNOSIS — R9431 Abnormal electrocardiogram [ECG] [EKG]: Secondary | ICD-10-CM | POA: Insufficient documentation

## 2012-04-28 MED ORDER — BENZONATATE 100 MG PO CAPS: 100.0000 mg | ORAL_CAPSULE | Freq: Two times a day (BID) | ORAL | Status: DC | PRN

## 2012-04-28 MED ORDER — BECLOMETHASONE DIPROPIONATE 80 MCG/ACT IN AERS: 1.0000 | INHALATION_SPRAY | Freq: Two times a day (BID) | RESPIRATORY_TRACT | Status: DC

## 2012-04-28 NOTE — Assessment & Plan Note (Signed)
Will start ICS and give 1 month trial to see if helps shortness of breath.  Will also have patient evaluated for coronary disease.  I have considered the possibility of PE but think it is very low likelihood.  Patient is not tachycardic, has normal O2 sats, is not tachypnic, and has no new leg swelling.

## 2012-04-28 NOTE — Progress Notes (Signed)
Patient ID: Kiara Cooper, female   DOB: Nov 20, 1965, 46 y.o.   MRN: [id number] Subjective: The patient is a 46 y.o. year old female who presents today for breathing issues.  Last week patient was seen and diagnosed with COPD exacerbation.  She was given prednisone.  Since then she has had less shortness of breath.  Has occasional feeling of airway closing up while sitting but this is infrequent.  She says that she gets very short of breath and gets some left shoulder pain and central chest pain with exertion.  She has a fair amount of coughing, especially with   Patient's past medical, social, and family history were reviewed and updated as appropriate. History  Substance Use Topics  . Smoking status: Former Games developer  . Smokeless tobacco: Never Used  . 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 Resp: Scattered wheezes with good air movement Ext: No edema  EKG: NSR, rate 78.  No acute ST segment changes.  Non-specific T wave changes in inferior leads and V6.  Assessment/Plan:  Please also see individual problems in problem list for problem-specific plans.

## 2012-04-28 NOTE — Patient Instructions (Signed)
It was good to see you today! I have sent in a medication for your cough and another inhaler.  Use the inhaler two times per day. We will get you in touch with a cardiologist to evaluate your heart.

## 2012-04-28 NOTE — Assessment & Plan Note (Signed)
While I think most of the patient's complaints can be attributed to COPD/cough, I feel that her current chest discomfort merits seeing a cardiologist, if for no other reason than to put the issue of cardiac involvement to rest.  Patient is likely not a candidate for an exercise stress test due to poor exercise capability.

## 2012-04-30 ENCOUNTER — Other Ambulatory Visit: Payer: Self-pay

## 2012-04-30 ENCOUNTER — Emergency Department (HOSPITAL_COMMUNITY): Payer: Medicaid Other

## 2012-04-30 ENCOUNTER — Encounter (HOSPITAL_COMMUNITY): Payer: Self-pay | Admitting: Emergency Medicine

## 2012-04-30 ENCOUNTER — Emergency Department (HOSPITAL_COMMUNITY)
Admission: EM | Admit: 2012-04-30 | Discharge: 2012-05-01 | Disposition: A | Discharge status: Home / Self Care | Payer: Medicaid Other | Attending: Emergency Medicine | Admitting: Emergency Medicine

## 2012-04-30 DIAGNOSIS — Z79899 Other long term (current) drug therapy: Secondary | ICD-10-CM | POA: Insufficient documentation

## 2012-04-30 DIAGNOSIS — J449 Chronic obstructive pulmonary disease, unspecified: Secondary | ICD-10-CM | POA: Insufficient documentation

## 2012-04-30 DIAGNOSIS — R0602 Shortness of breath: Secondary | ICD-10-CM | POA: Insufficient documentation

## 2012-04-30 DIAGNOSIS — I10 Essential (primary) hypertension: Secondary | ICD-10-CM | POA: Insufficient documentation

## 2012-04-30 DIAGNOSIS — J4489 Other specified chronic obstructive pulmonary disease: Secondary | ICD-10-CM | POA: Insufficient documentation

## 2012-04-30 DIAGNOSIS — R12 Heartburn: Secondary | ICD-10-CM | POA: Insufficient documentation

## 2012-04-30 LAB — CBC
HCT: 37 % (ref 36.0–46.0)
Hemoglobin: 12 g/dL (ref 12.0–15.0)
MCH: 28 pg (ref 26.0–34.0)
MCHC: 32.4 g/dL (ref 30.0–36.0)
RDW: 14 % (ref 11.5–15.5)

## 2012-04-30 LAB — BASIC METABOLIC PANEL
BUN: 19 mg/dL (ref 6–23)
Chloride: 101 mEq/L (ref 96–112)
Creatinine, Ser: 0.96 mg/dL (ref 0.50–1.10)
GFR calc Af Amer: 80 mL/min — ABNORMAL LOW (ref >90)
GFR calc non Af Amer: 69 mL/min — ABNORMAL LOW (ref >90)
Glucose, Bld: 160 mg/dL — ABNORMAL HIGH (ref 70–99)

## 2012-04-30 LAB — TROPONIN I: Troponin I: <0.3 ng/mL (ref <0.30)

## 2012-04-30 MED ORDER — GI COCKTAIL ~~LOC~~
30.0000 mL | Freq: Once | ORAL | Status: AC
  Administered 2012-04-30: 30 mL via ORAL
  Filled 2012-04-30: qty 30

## 2012-04-30 MED ORDER — ALBUTEROL SULFATE (5 MG/ML) 0.5% IN NEBU
5.0000 mg | INHALATION_SOLUTION | Freq: Once | RESPIRATORY_TRACT | Status: AC
  Administered 2012-04-30: 5 mg via RESPIRATORY_TRACT
  Filled 2012-04-30: qty 1

## 2012-04-30 NOTE — ED Notes (Addendum)
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

## 2012-04-30 NOTE — ED Notes (Signed)
Per EMS--has had lung infection intermittently since may--reports she has been asked to be admitted, and leaves AMA; was on [facility]--was prescribed prednisone but did not get it filled; lungs clear in all fields, 99% on RA; [facility]and staff reported that she has been walking around

## 2012-04-30 NOTE — ED Notes (Signed)
i-Stat Troponin Results:  cTnl 0.00 ng/mL 

## 2012-04-30 NOTE — ED Provider Notes (Signed)
History     CSN: [id number]  Arrival date & time 04/30/12  1820   First MD Initiated Contact with Patient 04/30/12 2236      Chief Complaint  Patient presents with  . Shortness of Breath    (Consider location/radiation/quality/duration/timing/severity/associated sxs/prior treatment) The history is provided by the patient and medical records.    Kiara Cooper is a 46 y.o. female presents to the emergency department complaining of heartburn, shortness of breath.  The onset of the symptoms was  Abrupt starting 10 hours ago.  The patient has associated chest discomfort, breathing problems for > 1 week and needing to use her albuterol almost every day, DOE.  The symptoms have been  persistent, gradually worsened.  nothing makes the symptoms worse and nothing makes symptoms better.  The patient denies fever, chills, headache, nausea, vomiting, diaphoresis, syncope, weakness, dizziness.  Pt states she drank cranmberry juice for lunch and the heartburn began after that.  She states she has been coughing a lot and that the last time she was here she was given an inhaled steroid, but she did not fill it.  She states she coughs green mucous every morning.  She denies wheezing.  Pt wears her CPAP at night without difficulty.      Past Medical History  Diagnosis Date  . Hypertension   . Arthritis   . Edema   . 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 No orthopnea. Epworth Sleepiness Scale is 10.  Plan: Polysomnography studies.    Marland Kitchen COPD (chronic obstructive pulmonary disease)     Past Surgical History  Procedure Date  . Hernia repair   . Cesarean section w/btl   . Tubal ligation     History reviewed. No pertinent family history.  History  Substance Use Topics  . Smoking status: Former Games developer  . Smokeless tobacco: Never Used  . 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  HENT: Positive for nosebleeds. Negative for congestion, rhinorrhea, sneezing, mouth sores, neck stiffness and sinus pressure.   Eyes: Negative for visual disturbance.  Respiratory: Positive for cough and shortness of breath. Negative for chest tightness and wheezing.   Cardiovascular: Positive for chest pain (burning).  Gastrointestinal: Negative for nausea, vomiting, abdominal pain, diarrhea and constipation.  Genitourinary: Negative for dysuria, urgency, frequency and hematuria.  Musculoskeletal: Positive for back pain (radiating from the front).  Skin: Negative for rash.  Neurological: Negative for syncope, light-headedness and headaches.  Psychiatric/Behavioral: Negative for disturbed wake/sleep cycle. The patient is not nervous/anxious.   All other systems reviewed and are negative.    Allergies  Review of patient's allergies indicates no known allergies.  Home Medications   Current Outpatient Rx  Name Route Sig Dispense Refill  . ALBUTEROL SULFATE HFA 108 (90 BASE) MCG/ACT IN AERS Inhalation Inhale 2 puffs into the lungs every 6 (six) hours as needed. For wheezing    . AMLODIPINE BESYLATE-VALSARTAN 10-320 MG PO TABS Oral Take 1 tablet by mouth daily.    . ASPIRIN EC 81 MG PO TBEC Oral Take 81  mg by mouth daily.    Marland Kitchen HYDROCHLOROTHIAZIDE 25 MG PO TABS Oral Take 1 tablet (25 mg total) by mouth daily. 90 tablet 3  . MELOXICAM 15 MG PO TABS Oral Take 1 tablet (15 mg total) by mouth daily. 30 tablet 3  . PREDNISONE 20 MG PO TABS Oral Take 2 tablets (40 mg total) by mouth daily. 10 tablet 0  . TIOTROPIUM BROMIDE MONOHYDRATE 18 MCG IN CAPS Inhalation Place 1 capsule (18 mcg total) into inhaler and inhale daily. 30 capsule 12  . BECLOMETHASONE DIPROPIONATE 80 MCG/ACT IN AERS  Inhalation Inhale 1 puff into the lungs 2 (two) times daily. 1 Inhaler 12  . BENZONATATE 100 MG PO CAPS Oral Take 100 mg by mouth 2 (two) times daily as needed. For cough      BP 113/62  Pulse 83  Temp 98.4 F (36.9 C) (Oral)  Resp 22  SpO2 100%  LMP 04/03/2012  Physical Exam  Nursing note and vitals reviewed. Constitutional: She appears well-developed and well-nourished. No distress.  HENT:  Head: Normocephalic and atraumatic.  Nose: Nose normal. Right sinus exhibits no maxillary sinus tenderness and no frontal sinus tenderness. Left sinus exhibits no maxillary sinus tenderness and no frontal sinus tenderness.  Mouth/Throat: Uvula is midline, oropharynx is clear and moist and mucous membranes are normal. No oropharyngeal exudate.  Eyes: Conjunctivae normal are normal. No scleral icterus.  Neck: Normal range of motion. Neck supple.  Cardiovascular: Normal rate, regular rhythm, normal heart sounds and intact distal pulses.  Exam reveals no gallop and no friction rub.   No murmur heard. Pulmonary/Chest: Effort normal and breath sounds normal. No respiratory distress. She has no wheezes. She has no rales. She exhibits no tenderness.  Abdominal: Soft. Bowel sounds are normal. She exhibits no mass. There is no tenderness. There is no rebound and no guarding.  Musculoskeletal: Normal range of motion. She exhibits edema (nonpitting, bilateral ).       No tenderness to palpation of the calf, no palpable cord, neg Homan's sign  Neurological: She is alert.       Speech is clear and goal oriented Moves extremities without ataxia  Skin: Skin is warm and dry. No rash noted. She is not diaphoretic.  Psychiatric: She has a normal mood and affect.    ED Course  Procedures (including critical care time)  Labs Reviewed  BASIC METABOLIC PANEL - Abnormal; Notable for the following:    Glucose, Bld 160 (*)     GFR calc non Af Amer 69 (*)     GFR calc Af Amer 80 (*)     All other components  within normal limits  CBC - Abnormal; Notable for the following:    WBC 11.0 (*)     All other components within normal limits  TROPONIN I   Dg Chest 2 View (if Patient Has Fever And/or Copd)  04/30/2012  *RADIOLOGY REPORT*  Clinical Data: 46 year old female shortness of breath back pain chest pain hypertension  CHEST - 2 VIEW  Comparison: 04/20/2012 and earlier.  Findings: Cardiac size at the upper limits of normal mildly enlarged. Other mediastinal contours are within normal limits. Visualized tracheal air column is within normal limits.  No pneumothorax or pleural effusion.  No pulmonary edema or consolidation. No acute osseous abnormality identified.  IMPRESSION: No acute cardiopulmonary abnormality.   Original Report Authenticated By: Harley Hallmark, M.D.     Results for orders placed during the hospital encounter of 04/30/12  BASIC METABOLIC  PANEL      Component Value Range   Sodium 138  135 - 145 mEq/L   Potassium 4.4  3.5 - 5.1 mEq/L   Chloride 101  96 - 112 mEq/L   CO2 28  19 - 32 mEq/L   Glucose, Bld 160 (*) 70 - 99 mg/dL   BUN 19  6 - 23 mg/dL   Creatinine, Ser 0.34  0.50 - 1.10 mg/dL   Calcium 9.6  8.4 - 74.2 mg/dL   GFR calc non Af Amer 69 (*) >90 mL/min   GFR calc Af Amer 80 (*) >90 mL/min  CBC      Component Value Range   WBC 11.0 (*) 4.0 - 10.5 K/uL   RBC 4.29  3.87 - 5.11 MIL/uL   Hemoglobin 12.0  12.0 - 15.0 g/dL   HCT 59.5  63.8 - 75.6 %   MCV 86.2  78.0 - 100.0 fL   MCH 28.0  26.0 - 34.0 pg   MCHC 32.4  30.0 - 36.0 g/dL   RDW 43.3  29.5 - 18.8 %   Platelets 319  150 - 400 K/uL  TROPONIN I      Component Value Range   Troponin I <0.30  <0.30 ng/mL   Dg Chest 2 View (if Patient Has Fever And/or Copd)  04/30/2012  *RADIOLOGY REPORT*  Clinical Data: 46 year old female shortness of breath back pain chest pain hypertension  CHEST - 2 VIEW  Comparison: 04/20/2012 and earlier.  Findings: Cardiac size at the upper limits of normal mildly enlarged. Other mediastinal  contours are within normal limits. Visualized tracheal air column is within normal limits.  No pneumothorax or pleural effusion.  No pulmonary edema or consolidation. No acute osseous abnormality identified.  IMPRESSION: No acute cardiopulmonary abnormality.   Original Report Authenticated By: Harley Hallmark, M.D.    Dg Chest Port 1 View  04/20/2012  *RADIOLOGY REPORT*  Clinical Data: Short of breath  PORTABLE CHEST - 1 VIEW  Comparison: 11/07/2011  Findings: Cardiac enlargement with mild vascular congestion. Negative for edema or effusion.  Negative for pneumonia.  IMPRESSION: Pulmonary vascular congestion without edema.   Original Report Authenticated By: Camelia Phenes, M.D.    ECG:  Date: 05/01/2012  Rate: 80  Rhythm: normal sinus rhythm  QRS Axis: normal  Intervals: normal  ST/T Wave abnormalities: normal and nonspecific T wave changes  Conduction Disutrbances:none  Narrative Interpretation: NSR with T wave inversion V6, unchanged from previous  Old EKG Reviewed: unchanged    1. Shortness of breath   2. Heartburn       MDM  Staci Righter presents with chest burning and cough.  [place] for ACS vs COPD vs GI upset from PO intake.  Pt also c/o gas with this pain.  Pt without Hx of DVT, no recent surgery, no long trips, no immobilization.  No tenderness to palpation of the calf, neg Homan's sign.  CBC BMP unremarkable.  Troponin negative.  Chest x-ray without acute pulmonary abnormality. Evidence of pneumonia.  Wills criteria low risk for PE.  Pt PERC negative.  Patient states she feels some better after GI cocktail. Chest pain is not likely of cardiac or pulmonary etiology d/t presentation, perc negative, VSS, no tracheal deviation, no JVD or new murmur, RRR, breath sounds equal bilaterally, EKG without acute abnormalities, negative troponin, and negative CXR. Pt has been advised start a PPI and return to the ED is CP becomes exertional, associated with diaphoresis or nausea, radiates to  left jaw/arm, worsens or becomes concerning in any way. Pt appears reliable for follow up and is agreeable to discharge.   Case has been discussed with and seen by Dr. Verl Bangs who agrees with the above plan to discharge.   1. Medications: prilosec 2. Treatment: Rest, drink plenty of fluids, take medications as prescribed 3. Follow Up: Followup with primary care physician tomorrow morning      Dierdre Forth, PA-C 05/01/12 0041

## 2012-05-01 MED ORDER — OMEPRAZOLE 20 MG PO CPDR: 20.0000 mg | DELAYED_RELEASE_CAPSULE | Freq: Every day | ORAL | Status: DC

## 2012-05-01 NOTE — ED Provider Notes (Signed)
Medical screening examination/treatment/procedure(s) were performed by non-physician practitioner and as supervising physician I was immediately available for consultation/collaboration.   Loren Racer, MD 05/01/12 2306

## 2012-05-11 ENCOUNTER — Telehealth: Payer: Self-pay | Admitting: Family Medicine

## 2012-05-11 NOTE — Telephone Encounter (Signed)
Pt is asking to speak with nurse about her being out of breath

## 2012-05-11 NOTE — Telephone Encounter (Signed)
Patient had to walk to come to phone. Sounds very short of breath. After she rested a bit breathing better. States she has been short of breath with exertion for 2 days now. Inhalers not helping. Denies chest pain or fever. Much coughing she states. Advised patient to go to ED now for evaluation.  Advised her she cannot wait for appointment here in the morning , needs to be seen today.  She voices understanding and will go to ED.

## 2012-05-12 ENCOUNTER — Emergency Department (HOSPITAL_COMMUNITY): Payer: Medicaid Other

## 2012-05-12 ENCOUNTER — Encounter (HOSPITAL_COMMUNITY): Payer: Self-pay | Admitting: *Deleted

## 2012-05-12 ENCOUNTER — Emergency Department (HOSPITAL_COMMUNITY)
Admission: EM | Admit: 2012-05-12 | Discharge: 2012-05-12 | Disposition: A | Discharge status: Home / Self Care | Payer: Medicaid Other | Attending: Emergency Medicine | Admitting: Emergency Medicine

## 2012-05-12 DIAGNOSIS — Z87891 Personal history of nicotine dependence: Secondary | ICD-10-CM | POA: Insufficient documentation

## 2012-05-12 DIAGNOSIS — R0682 Tachypnea, not elsewhere classified: Secondary | ICD-10-CM | POA: Insufficient documentation

## 2012-05-12 DIAGNOSIS — J441 Chronic obstructive pulmonary disease with (acute) exacerbation: Secondary | ICD-10-CM

## 2012-05-12 DIAGNOSIS — Z79899 Other long term (current) drug therapy: Secondary | ICD-10-CM | POA: Insufficient documentation

## 2012-05-12 DIAGNOSIS — R059 Cough, unspecified: Secondary | ICD-10-CM | POA: Insufficient documentation

## 2012-05-12 DIAGNOSIS — M129 Arthropathy, unspecified: Secondary | ICD-10-CM | POA: Insufficient documentation

## 2012-05-12 DIAGNOSIS — I10 Essential (primary) hypertension: Secondary | ICD-10-CM | POA: Insufficient documentation

## 2012-05-12 DIAGNOSIS — R05 Cough: Secondary | ICD-10-CM | POA: Insufficient documentation

## 2012-05-12 DIAGNOSIS — R0602 Shortness of breath: Secondary | ICD-10-CM | POA: Insufficient documentation

## 2012-05-12 MED ORDER — IPRATROPIUM BROMIDE 0.02 % IN SOLN
0.5000 mg | Freq: Once | RESPIRATORY_TRACT | Status: AC
  Administered 2012-05-12: 0.5 mg via RESPIRATORY_TRACT
  Filled 2012-05-12: qty 2.5

## 2012-05-12 MED ORDER — ALBUTEROL SULFATE (5 MG/ML) 0.5% IN NEBU
5.0000 mg | INHALATION_SOLUTION | Freq: Once | RESPIRATORY_TRACT | Status: AC
  Administered 2012-05-12: 5 mg via RESPIRATORY_TRACT
  Filled 2012-05-12: qty 1

## 2012-05-12 MED ORDER — ALBUTEROL SULFATE (5 MG/ML) 0.5% IN NEBU
5.0000 mg | INHALATION_SOLUTION | Freq: Once | RESPIRATORY_TRACT | Status: AC
  Administered 2012-05-12: 5 mg via RESPIRATORY_TRACT
  Filled 2012-05-12: qty 0.5

## 2012-05-12 MED ORDER — PREDNISONE 20 MG PO TABS: 40.0000 mg | ORAL_TABLET | Freq: Every day | ORAL | Status: DC

## 2012-05-12 MED ORDER — PREDNISONE 20 MG PO TABS
60.0000 mg | ORAL_TABLET | Freq: Once | ORAL | Status: AC
  Administered 2012-05-12: 60 mg via ORAL
  Filled 2012-05-12: qty 3

## 2012-05-12 NOTE — ED Notes (Signed)
Pt from home with reports of worsening shortness of breath that started on Sunday, audible wheezing noted. Pt reports that she used prescribed inhalers without improvement. Pt endorses hx of same as well as COPD and stopped smoking 8 months ago.

## 2012-05-12 NOTE — ED Notes (Signed)
RT at bedside.

## 2012-05-12 NOTE — ED Notes (Signed)
RT called for assess and breathing tx.

## 2012-05-12 NOTE — ED Provider Notes (Signed)
History     CSN: [id number]  Arrival date & time 05/12/12  4098   First MD Initiated Contact with Patient 05/12/12 [phone]      Chief Complaint  Patient presents with  . Shortness of Breath    (Consider location/radiation/quality/duration/timing/severity/associated sxs/prior treatment) Patient is a 46 y.o. female presenting with shortness of breath. The history is provided by the patient.  Shortness of Breath  The current episode started yesterday. The onset was gradual. The problem occurs continuously. The problem has been gradually worsening. The problem is severe. Nothing relieves the symptoms. The symptoms are aggravated by activity. Associated symptoms include cough, shortness of breath and wheezing. Pertinent negatives include no chest pain, no fever and no rhinorrhea. She has had intermittent steroid use (finished steroids last week). She has had no prior ICU admissions. She has had no prior intubations. Her past medical history is significant for asthma. Urine output has been normal. The last void occurred less than 6 hours ago. There were no sick contacts. Recently, medical care has been given by the PCP. Services Performed: steroid inhaler, albuterol and spiriva.    Past Medical History  Diagnosis Date  . Hypertension   . Arthritis   . Edema   . 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 No orthopnea. Epworth Sleepiness Scale is 10.  Plan: Polysomnography studies.    Marland Kitchen COPD (chronic obstructive pulmonary disease)     Past Surgical History  Procedure Date  . Hernia repair   . Cesarean section w/btl   . Tubal ligation     History reviewed. No pertinent family history.  History  Substance Use Topics  . Smoking status: Former Smoker    Quit date: 09/13/2011  . Smokeless tobacco: Never Used  . 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  HENT: Negative for rhinorrhea.   Respiratory: Positive for cough, shortness of breath and wheezing.   Cardiovascular: Negative for chest pain.  All other systems reviewed and are negative.    Allergies  Review of patient's allergies indicates no known allergies.  Home Medications   Current Outpatient Rx  Name Route Sig Dispense Refill  . ALBUTEROL SULFATE HFA 108 (90 BASE) MCG/ACT IN AERS Inhalation Inhale 2 puffs into the lungs every 6 (six) hours as needed. For wheezing    . AMLODIPINE BESYLATE-VALSARTAN 10-320 MG PO TABS Oral Take 1 tablet by mouth daily.    . ASPIRIN EC 81 MG PO TBEC Oral Take 81 mg by mouth daily.    . BECLOMETHASONE DIPROPIONATE 80 MCG/ACT IN AERS Inhalation Inhale 1 puff into the lungs 2 (two) times daily. 1 Inhaler 12  . BENZONATATE 100 MG PO CAPS Oral Take 100 mg by mouth 2 (two) times daily as needed. For cough    . HYDROCHLOROTHIAZIDE 25 MG PO TABS Oral Take 1 tablet (25 mg total) by mouth daily. 90 tablet 3  . MELOXICAM 15 MG PO TABS Oral Take 1 tablet (15 mg total) by mouth daily. 30 tablet 3  . OMEPRAZOLE 20 MG PO CPDR Oral Take 1 capsule (20 mg total) by mouth daily.  30 capsule 0  . PREDNISONE 20 MG PO TABS Oral Take 2 tablets (40 mg total) by mouth daily. 10 tablet 0  . TIOTROPIUM BROMIDE MONOHYDRATE 18 MCG IN CAPS Inhalation Place 1 capsule (18 mcg total) into inhaler and inhale daily. 30 capsule 12    BP 130/53  Pulse 90  Temp 98.4 F (36.9 C) (Oral)  Resp 20  Wt 331 lb (150.141 kg)  SpO2 100%  LMP 05/11/2012  Physical Exam  Nursing note and vitals reviewed. Constitutional: She is oriented to person, place, and time. She appears well-developed and well-nourished. No distress.  HENT:  Head: Normocephalic and atraumatic.  Mouth/Throat: Oropharynx is clear and moist.  Eyes: Conjunctivae normal and EOM are normal.  Pupils are equal, round, and reactive to light.  Neck: Normal range of motion. Neck supple.  Cardiovascular: Normal rate, regular rhythm and intact distal pulses.   No murmur heard. Pulmonary/Chest: Tachypnea noted. No respiratory distress. She has wheezes. She has no rales.  Abdominal: Soft. She exhibits no distension. There is no tenderness. There is no rebound and no guarding.  Musculoskeletal: Normal range of motion. She exhibits no edema and no tenderness.  Neurological: She is alert and oriented to person, place, and time.  Skin: Skin is warm and dry. No rash noted. No erythema.  Psychiatric: She has a normal mood and affect. Her behavior is normal.    ED Course  Procedures (including critical care time)  Labs Reviewed - No data to display Dg Chest 2 View  05/12/2012  *RADIOLOGY REPORT*  Clinical Data: Shortness of breath.  Wheezing.  Hypertension.  CHEST - 2 VIEW  Comparison: 04/30/2012 and 10/16/2011.  Findings: Heart size top normal to slightly enlarged.  Central pulmonary vascular prominence unchanged.  No segmental infiltrate or pneumothorax.  Degenerative changes lower thoracic spine.  IMPRESSION: Heart size top normal to slightly enlarged.  Central pulmonary vascular prominence unchanged.   Original Report Authenticated By: Fuller Canada, M.D.      No diagnosis found.    MDM   Pt with typical asthma exacerbation  Symptoms, just finished prednisone last week.  No infectious sx, productive cough or other complaints.  Wheezing on exam.  will give steroids, albuterol/atrovent and recheck.  9:49 AM Patient's wheezing on exam there is some improvement. We'll get a second neb. X-rays are unrevealing  11:31 AM Patient is much improved after second treatment and ready to go home. Will discharge home with prednisone and she will followup with her doctor later this week     Gwyneth Sprout, MD 05/12/12 1131

## 2012-05-12 NOTE — ED Notes (Signed)
RT called for repeat breathing tx. Pt again noted to have dyspnea at rest and is sitting upright in bed, will monitor.

## 2012-05-12 NOTE — ED Notes (Signed)
MD at bedside. 

## 2012-05-13 ENCOUNTER — Ambulatory Visit (INDEPENDENT_AMBULATORY_CARE_PROVIDER_SITE_OTHER): Payer: Medicaid Other | Admitting: Family Medicine

## 2012-05-13 VITALS — BP 152/81 | HR 79 | Ht 65.0 in | Wt 336.0 lb

## 2012-05-13 DIAGNOSIS — J449 Chronic obstructive pulmonary disease, unspecified: Secondary | ICD-10-CM

## 2012-05-13 DIAGNOSIS — R0602 Shortness of breath: Secondary | ICD-10-CM

## 2012-05-13 MED ORDER — IPRATROPIUM BROMIDE 0.02 % IN SOLN
0.5000 mg | Freq: Once | RESPIRATORY_TRACT | Status: AC
  Administered 2012-05-13: 0.5 mg via RESPIRATORY_TRACT

## 2012-05-13 MED ORDER — FUROSEMIDE 20 MG PO TABS: 20.0000 mg | ORAL_TABLET | Freq: Every day | ORAL | Status: DC

## 2012-05-13 MED ORDER — ALBUTEROL SULFATE (2.5 MG/3ML) 0.083% IN NEBU
2.5000 mg | INHALATION_SOLUTION | Freq: Once | RESPIRATORY_TRACT | Status: AC
  Administered 2012-05-13: 2.5 mg via RESPIRATORY_TRACT

## 2012-05-13 MED ORDER — LEVOFLOXACIN 500 MG PO TABS: 500.0000 mg | ORAL_TABLET | Freq: Every day | ORAL | Status: DC

## 2012-05-13 NOTE — Patient Instructions (Addendum)
It has been a pleasure to see you today. Please take the medications as prescribed. Start taking Furosemide 1 tab (20 mg) daily. Start taking Levaquin 1 tab (500 mg ) dily for 7 days Continue taking Prednisone 2 tab (40 mg) daily until your next appointment. Stop taking Hydrochlorothiazide at this time, you may restart later so do not discard remaining tablets. Albuterol 2 puffs every 4h for the next 24 h. Then every 6 h and after 48 h you can use it as needed for Shortness of breath. Spiriva 2 puffs every 8 h for thr next 48 h and then as needed for Shortness of breath  Make your next appointment for next Monday.

## 2012-05-15 ENCOUNTER — Encounter: Payer: Self-pay | Admitting: *Deleted

## 2012-05-15 ENCOUNTER — Encounter: Payer: Self-pay | Admitting: Cardiology

## 2012-05-15 ENCOUNTER — Ambulatory Visit: Payer: Medicaid Other | Admitting: Cardiology

## 2012-05-15 ENCOUNTER — Encounter: Payer: Self-pay | Admitting: Family Medicine

## 2012-05-15 ENCOUNTER — Ambulatory Visit (INDEPENDENT_AMBULATORY_CARE_PROVIDER_SITE_OTHER): Payer: Medicaid Other | Admitting: Cardiology

## 2012-05-15 VITALS — BP 116/58 | HR 95 | Ht 65.0 in | Wt 333.0 lb

## 2012-05-15 DIAGNOSIS — R079 Chest pain, unspecified: Secondary | ICD-10-CM

## 2012-05-15 DIAGNOSIS — R0602 Shortness of breath: Secondary | ICD-10-CM

## 2012-05-15 DIAGNOSIS — I10 Essential (primary) hypertension: Secondary | ICD-10-CM

## 2012-05-15 NOTE — Progress Notes (Signed)
  Subjective:    Patient ID: Kiara Cooper, female    DOB: July 27, 1966, 46 y.o.   MRN: [id number]  HPI Pt that comes today for COPD exacerbation f/u. She was seen yesterday at the ED for thi,s given bronchodilator nebs and started on Prednisone. Today O2 sats are wnl but pt still with remarkable wheezing. Albuterol/Ipratropium given during visit with improvement. Afebrile, no rhinorrhea, congestion, sore throat or cough. No sputum production. Pt only complains of SOB and more with exertion.  Review of Systems Pt denies, chest pain, palpitations, headaches, dizziness, numbness or weakness. No changes on urinary or BM habits.     Objective:   Physical Exam Gen:  NAD HEENT: Moist mucous membranes  CV: Regular rate and rhythm, no murmurs rubs or gallops. Do not appreciate JVD. But pt is obese with very wide neck. PULM: diminish breath sound bilaterally. Wheezes present throughout lung fields bilaterally. ABD: Globulose due to obesity. I do not perceive hepatomegaly or hepatojugular reflux. EXT:1 + pitting edema bilaterally up to 2/3 of legs (below knee) Neuro: Alert and oriented x3. No focalization     Assessment & Plan:

## 2012-05-15 NOTE — Assessment & Plan Note (Addendum)
Most likely COPD exacerbation, but maybe her diastolic dysfunction is playing a component. CXR unchanged, no proBNP . I also wonder if pt is really using inhalers as directed since the improvement on her wheezing was remarkable after only one treatment at the office. Plan: Continue Prednisone 40 mg daily for 5 days but pending to evaluation next Monday she will probably benefit from a longer therapy. Added Abx: Levaquin daily for 7 days Added scheduled regimen of SABA Added Furosemide 20 mg daily and stop HCTZ while on Furosemide. If pt does not improve by Monday please consider ProBNP. When pt recuperated from this episode LABA therapy will be discussed.

## 2012-05-15 NOTE — Progress Notes (Signed)
HPI The patient presents for evaluation of chest discomfort. She has no prior cardiac history. I do see an echocardiogram earlier this year that demonstrated well-preserved ejection fraction and no evidence of valvular abnormalities or wall motion abnormalities. She does have a recent diagnosis of lung disease and did smoke heavily until recently. She presents for evaluation of chest discomfort. This happens sporadically. 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 With this she does not bring on the pain. The pain however is a deep pinching twisting pain. It goes across her chest and breasts. She sometimes feels her heart throbbing. She does get dyspneic with mild-to-moderate exertion and this has been slowly progressive over time. She's not describing any PND or orthopnea. She has had weight gain when treated with steroids for her lungs. This has been significant recently. She does have bilateral lower extremity edema.  No Known Allergies  Current Outpatient Prescriptions  Medication Sig Dispense Refill  . albuterol (PROVENTIL HFA;VENTOLIN HFA) 108 (90 BASE) MCG/ACT inhaler Inhale 2 puffs into the lungs every 6 (six) hours as needed. For wheezing      . amLODipine-valsartan (EXFORGE) 10-320 MG per tablet Take 1 tablet by mouth daily.      . beclomethasone (QVAR) 80 MCG/ACT inhaler Inhale 1 puff into the lungs 2 (two) times daily.      . benzonatate (TESSALON) 100 MG capsule Take 100 mg by mouth 2 (two) times daily as needed. For cough      . furosemide (LASIX) 20 MG tablet Take 1 tablet (20 mg total) by mouth daily.  30 tablet  3  . levofloxacin (LEVAQUIN) 500 MG tablet Take 1 tablet (500 mg total) by mouth daily.  7 tablet  0  . meloxicam (MOBIC) 15 MG tablet Take 15 mg by mouth daily.      . predniSONE (DELTASONE) 20 MG tablet Take 2 tablets (40 mg total) by mouth daily.  10 tablet  0  . tiotropium (SPIRIVA) 18 MCG inhalation capsule Place 18 mcg into inhaler and inhale daily.      .  hydrochlorothiazide (HYDRODIURIL) 25 MG tablet Take 25 mg by mouth daily.        Past Medical History  Diagnosis Date  . Hypertension   . Arthritis   . Edema   . 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 No orthopnea. Epworth Sleepiness Scale is 10.  Plan: Polysomnography studies.    Marland Kitchen COPD (chronic obstructive pulmonary disease)     Past Surgical History  Procedure Date  . Hernia repair     Umbilical  . Cesarean section w/btl   . Tubal ligation     Family History  Problem Relation Age of Onset  . Diabetes Father   . Stroke Mother   . Coronary artery disease Maternal Aunt 77  . Coronary artery disease Maternal Grandfather 35    History   Social History  . Marital Status: Single    Spouse Name: N/A    Number of Children: 2  . Years of Education: N/A   Occupational History  . Not on file.   Social History Main Topics  . Smoking status: Former Smoker -- 2.0 packs/day for 30 years    Types: Cigarettes    Quit date: 09/12/2011  . Smokeless tobacco: Never Used  . Alcohol Use: No     off alcohol since 08/22/11 -every day about 24 pack  a day  . Drug Use: No     used cocaine for 25 years, same quit date as alcohol.   . Sexually Active: No      for 28 years. Quitted in 08/22/2011   Other Topics Concern  . Not on file   Social History Narrative  . No narrative on file   ROS:   Positive for glasses, palpitations, call, wheezing, leg swelling, joint pains. Otherwise as stated in the history of present illness and negative for all other systems.  PHYSICAL EXAM BP 116/58  Pulse 95  Ht 5\' 5"  (1.651 m)  Wt 333 lb (151.048 kg)  BMI 55.41 kg/m2  LMP 05/11/2012 GENERAL:  Well appearing HEENT:  Pupils equal round and reactive, fundi not visualized, oral mucosa unremarkable NECK:  No jugular venous distention, waveform within normal limits, carotid upstroke brisk and symmetric, no bruits, no  thyromegaly LYMPHATICS:  No cervical, inguinal adenopathy LUNGS:  Mild exp wheezing BACK:  No CVA tenderness CHEST:  Unremarkable HEART:  PMI not displaced or sustained,S1 and S2 within normal limits, no S3, no S4, no clicks, no rubs, no murmurs ABD:  Flat, positive bowel sounds normal in frequency in pitch, no bruits, no rebound, no guarding, no midline pulsatile mass, no hepatomegaly, no splenomegaly, obese EXT:  2 plus pulses throughout, mild edema, no cyanosis no clubbing SKIN:  No rashes no nodules NEURO:  Cranial nerves II through XII grossly intact, motor grossly intact throughout PSYCH:  Cognitively intact, oriented to person place and time  EKG:  Sinus rhythm, rate 85, axis within normal limits, intervals within normal limits, poor anterior R wave progression, consider old anteroseptal infarct, no acute ST-T wave changes. 05/15/2012  ASSESSMENT AND PLAN  Chest pain - Her chest pain is somewhat atypical. Given her risk factors however stress testing is indicated. She would not be able to walk on a treadmill. With her wheezing I would prefer to avoid Lexiscan Myoview.  I will order a dobutamine echocardiogram  COPD - She would like to see a pulmonologist. She's being managed by Lillia Abed, MD.  I will defer this referral to Dr. Aviva Signs.  Obesity - The patient understands the need to lose weight with diet and exercise. She has some joint pain which limits her ability to exercise. We discussed some details of weight loss with calorie counting.  HTN - Her blood pressure is well controlled. She will continue the meds as listed.

## 2012-05-15 NOTE — Patient Instructions (Addendum)
The current medical regimen is effective;  continue present plan and medications.  Your physician has requested that you have a dobutamine echocardiogram. For further information please visit [email or web address] Please follow instruction sheet as given.  Follow up after testing

## 2012-05-18 ENCOUNTER — Encounter: Payer: Self-pay | Admitting: Family Medicine

## 2012-05-18 ENCOUNTER — Ambulatory Visit (INDEPENDENT_AMBULATORY_CARE_PROVIDER_SITE_OTHER): Payer: Medicaid Other | Admitting: Family Medicine

## 2012-05-18 VITALS — BP 140/80 | HR 81 | Temp 98.6°F | Wt 333.0 lb

## 2012-05-18 DIAGNOSIS — R0602 Shortness of breath: Secondary | ICD-10-CM

## 2012-05-18 MED ORDER — HUGO ROLLING WALKER ELITE MISC: Status: DC

## 2012-05-18 MED ORDER — ALBUTEROL SULFATE HFA 108 (90 BASE) MCG/ACT IN AERS: 2.0000 | INHALATION_SPRAY | Freq: Four times a day (QID) | RESPIRATORY_TRACT | Status: DC | PRN

## 2012-05-18 NOTE — Patient Instructions (Signed)
Kiara Cooper was seen today for her shortness of breath.  We will get a lab on you today called a Pro-BNP to look for a cause of your swelling and shortness of breath.  You are scheduled to see your cardiologist on Friday for another test to evaluate your heart.  We will like you to follow up with Dr. Aviva Signs early next week to discuss where to go from here.   Thanks, Dr. Paulina Fusi

## 2012-05-18 NOTE — Progress Notes (Signed)
Kiara Cooper is a 46 y.o. who presents today for f/u of her COPD exacerbation.  She saw Dr. Aviva Signs last week for this and was placed on 5 days of prednisone, 7 days of levaquin, albuterol q 4 hrs PRN, and also continued on her spiriva.  She was started on Lasix 20 mg as well for possible CHF exacerbation.    She presents to clinic today with little improvement over the last couple of days.  She continues to be SOB while walking less than one block, continues to have LE edema but has improved.  She denies productive cough, fever, chills, pleuritic chest pain, palpitations. O2 sats today were 100% after ambulation and no dizziness or chest pain/pressure.   Past Medical History  Diagnosis Date  . Hypertension   . Arthritis   . Edema   . 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 No orthopnea. Epworth Sleepiness Scale is 10.  Plan: Polysomnography studies.    Marland Kitchen COPD (chronic obstructive pulmonary disease)     History    Social History Main Topics  . Smoking status: Former Smoker -- 2.0 packs/day for 30 years    Types: Cigarettes    Quit date: 09/12/2011  . Smokeless tobacco: Never Used  . Alcohol Use: No     off alcohol since 08/22/11 -every day about 24 pack a day  . Drug Use: No     used cocaine for 25 years, same quit date as alcohol.   . Sexually Active: No      for 28 years. Quitted in 08/22/2011      Family History  Problem Relation Age of Onset  . Diabetes Father   . Stroke Mother   . Coronary artery disease Maternal Aunt 77  . Coronary artery disease Maternal Grandfather 89    Current Outpatient Prescriptions on File Prior to Visit  Medication Sig Dispense Refill  . albuterol (PROVENTIL HFA;VENTOLIN HFA) 108 (90 BASE) MCG/ACT inhaler Inhale 2 puffs into the lungs every 6 (six) hours as needed. For wheezing      . amLODipine-valsartan (EXFORGE) 10-320 MG per tablet Take 1 tablet by mouth daily.        . beclomethasone (QVAR) 80 MCG/ACT inhaler Inhale 1 puff into the lungs 2 (two) times daily.      . benzonatate (TESSALON) 100 MG capsule Take 100 mg by mouth 2 (two) times daily as needed. For cough      . furosemide (LASIX) 20 MG tablet Take 1 tablet (20 mg total) by mouth daily.  30 tablet  3  . hydrochlorothiazide (HYDRODIURIL) 25 MG tablet Take 25 mg by mouth daily.      Marland Kitchen levofloxacin (LEVAQUIN) 500 MG tablet Take 1 tablet (500 mg total) by mouth daily.  7 tablet  0  . meloxicam (MOBIC) 15 MG tablet Take 15 mg by mouth daily.      . predniSONE (DELTASONE) 20 MG tablet Take 2 tablets (40 mg total) by mouth daily.  10 tablet  0  . tiotropium (SPIRIVA) 18 MCG inhalation capsule Place 18 mcg into inhaler and inhale daily.        Physical Exam Filed Vitals:   05/18/12 1458  BP: 140/80  Pulse: 81  Temp: 98.6 F (37 C)    Gen: Obese, NAD HEENT: PERLA, EOMI, [place]/AT Neck: no JVD Cardio: RRR, No murmurs/gallops/rubs Lungs: CTA, no wheezes, rhonchi, crackles, + increased WOB, no accessory  muscle use Abd: NABS, soft nontender nondistended MSK: ROM normal  Extremities: +2 edema B/L pitting Psych: AAO x 3

## 2012-05-18 NOTE — Assessment & Plan Note (Signed)
Continues to have increased SOB despite completing tx of 5 days of prednisone and being on day 6/7 of levaquin for possible COPD exacerbation.  Also is using albuterol inhaler PRN and spir iva.  Will check BNP today to evaluate for worsening heart failure.  Also, is getting dobutamine stress echo on Friday so will not repeat CXR today.  Will have her f/u with her PCP after her stress echo on Friday.

## 2012-05-19 ENCOUNTER — Telehealth: Payer: Self-pay | Admitting: Family Medicine

## 2012-05-19 NOTE — Telephone Encounter (Signed)
Got a script for Misc. Devices (HUGO ROLLING WALKER [employer]ITE) MISC  needs to say for a 4 wheel walker - would like to pick up new script tomorrow

## 2012-05-20 NOTE — Telephone Encounter (Signed)
Pt called to see if this is ready and when she can pick this up

## 2012-05-20 NOTE — Telephone Encounter (Signed)
I will be tomorrow in clinic.

## 2012-05-20 NOTE — Telephone Encounter (Signed)
Patient is calling to let her MD know that [facility] says that in order for MCD to cover, the rx needs to say Four Wheeled Walker.

## 2012-05-21 NOTE — Telephone Encounter (Signed)
Prescription placed on the "call box"

## 2012-05-21 NOTE — Telephone Encounter (Signed)
Forwarded to pcp.Katrinia Straker Lynetta  

## 2012-05-22 ENCOUNTER — Other Ambulatory Visit (HOSPITAL_COMMUNITY): Payer: Self-pay | Admitting: *Deleted

## 2012-05-22 ENCOUNTER — Encounter: Payer: Self-pay | Admitting: Cardiology

## 2012-05-22 ENCOUNTER — Ambulatory Visit (HOSPITAL_COMMUNITY): Payer: Medicaid Other | Attending: Cardiology

## 2012-05-22 ENCOUNTER — Ambulatory Visit (HOSPITAL_BASED_OUTPATIENT_CLINIC_OR_DEPARTMENT_OTHER): Payer: Medicaid Other

## 2012-05-22 DIAGNOSIS — R079 Chest pain, unspecified: Secondary | ICD-10-CM

## 2012-05-22 DIAGNOSIS — R0602 Shortness of breath: Secondary | ICD-10-CM

## 2012-05-22 DIAGNOSIS — J449 Chronic obstructive pulmonary disease, unspecified: Secondary | ICD-10-CM | POA: Insufficient documentation

## 2012-05-22 DIAGNOSIS — J4489 Other specified chronic obstructive pulmonary disease: Secondary | ICD-10-CM | POA: Insufficient documentation

## 2012-05-22 DIAGNOSIS — R0609 Other forms of dyspnea: Secondary | ICD-10-CM | POA: Insufficient documentation

## 2012-05-22 DIAGNOSIS — I10 Essential (primary) hypertension: Secondary | ICD-10-CM | POA: Insufficient documentation

## 2012-05-22 DIAGNOSIS — R0989 Other specified symptoms and signs involving the circulatory and respiratory systems: Secondary | ICD-10-CM | POA: Insufficient documentation

## 2012-05-22 DIAGNOSIS — R072 Precordial pain: Secondary | ICD-10-CM

## 2012-05-22 MED ORDER — SODIUM CHLORIDE 0.9 % IV SOLN
40.0000 ug/kg | Freq: Once | INTRAVENOUS | Status: AC
  Administered 2012-05-22: 40 ug/kg/min via INTRAVENOUS

## 2012-05-22 MED ORDER — ATROPINE SULFATE 0.1 MG/ML IJ SOLN
1.0000 mg | Freq: Once | INTRAMUSCULAR | Status: AC
  Administered 2012-05-22: 1 mg via INTRAVENOUS

## 2012-05-22 NOTE — Progress Notes (Signed)
Echocardiogram performed.  

## 2012-05-29 ENCOUNTER — Encounter: Payer: Self-pay | Admitting: Family Medicine

## 2012-05-29 ENCOUNTER — Ambulatory Visit (INDEPENDENT_AMBULATORY_CARE_PROVIDER_SITE_OTHER): Payer: Medicaid Other | Admitting: Family Medicine

## 2012-05-29 VITALS — BP 141/95 | HR 94 | Temp 98.2°F | Ht 65.0 in | Wt 338.3 lb

## 2012-05-29 DIAGNOSIS — R0602 Shortness of breath: Secondary | ICD-10-CM

## 2012-05-29 DIAGNOSIS — Z23 Encounter for immunization: Secondary | ICD-10-CM

## 2012-05-29 DIAGNOSIS — J449 Chronic obstructive pulmonary disease, unspecified: Secondary | ICD-10-CM

## 2012-05-29 DIAGNOSIS — M1710 Unilateral primary osteoarthritis, unspecified knee: Secondary | ICD-10-CM

## 2012-05-29 DIAGNOSIS — E669 Obesity, unspecified: Secondary | ICD-10-CM

## 2012-05-29 MED ORDER — MELOXICAM 15 MG PO TABS: 15.0000 mg | ORAL_TABLET | Freq: Every day | ORAL | Status: DC

## 2012-05-29 MED ORDER — IPRATROPIUM-ALBUTEROL 0.5-2.5 (3) MG/3ML IN SOLN: 3.0000 mL | Freq: Four times a day (QID) | RESPIRATORY_TRACT | Status: DC | PRN

## 2012-05-29 NOTE — Assessment & Plan Note (Signed)
Would like to get informed and consider Bariatric Surgery. Plan: Long discussion about her motivation to lose weight.  Information about seminar offered at [facility] given to pt.

## 2012-05-29 NOTE — Progress Notes (Signed)
Family Medicine Office Visit Note   Subjective:   Patient ID: Kiara Cooper, female  DOB: 06-08-1966, 46 y.o.. MRN: [id number]   Pt that comes today still complaining of SOB. She has been with this symptoms for quite a while. Last visit she was continued on Prednisone started by ED, we started her on Levaquin and switched from HCTZ to Lasix to cover for any component of diastolic CHF that could be playing a role on her exacerbation. Also she was continuing on and inhaled steroid, Spiriva and Albuterol. She reports compliance but no improvement. Her SOB is constant, she has noticed that when albuterol is given as nebulizer she has better response than when using inhaler.  Also interested in information about  bariatric surgery to treat her morbid obesity and that way help to improve her other medical conditions.  Review of Systems:  Per HPI Objective:   Physical Exam: Gen:  NAD.  HEENT: Moist mucous membranes. No JVD CV: Regular rate and rhythm, no murmurs rubs or gallops PULM: Diminish Breath sounds bilaterally. No wheezes/rales/rhonchi auscultated at this time. EXT: trace ankle edema  Assessment & Plan:

## 2012-05-29 NOTE — Assessment & Plan Note (Addendum)
Continues with SOB, no auscultatory findings of wheezes or rales on physical exam. Finished abx and systemic steroids. Plan: Pt precepted with attending Dr. Leveda Anna.  - Stop Spiriva and Albuterol inhaler. Continue Qvar. - Start Nebulizer with Duoneb Q6 PRN (given prescription for machine and medication) - Reevaluate in a week and possible referral to Pulmonology if no clinical improvement.

## 2012-05-29 NOTE — Assessment & Plan Note (Signed)
Pt reports using CPAP but not on regular bases. Plan: Explained secondary effects of Sleep apnea and the benefits of CPAP use. Pt voiced understanding.

## 2012-05-29 NOTE — Patient Instructions (Addendum)
Please take the medications as follows. Duoneb 3 ml in a nebulizer machine every 6h as needed for shortness of breath or wheezing. Continue Qvar daily as prescribed. Stop taking the other inhalers  Next appointment in 1 weeks or sooner if needed. Continue taking furosemide and rest of blood pressure medication as prescribed. Please continue using the CPAP machine for your Sleep Apnea.

## 2012-06-03 ENCOUNTER — Telehealth: Payer: Self-pay | Admitting: Family Medicine

## 2012-06-03 ENCOUNTER — Encounter: Payer: Self-pay | Admitting: *Deleted

## 2012-06-03 NOTE — Telephone Encounter (Signed)
Patient needs prescriptions for Nebulizer Machine and Dan Humphreys to be reprinted due to stipulations of Medicaid. The Nebulizer Machine must have a diagnosis code of COPD on prescription in order for Medicaid to pay. Also, The walker prescription must state BARIATRIC WALKER WITH SEAT and also include a diagnosis code for reason patient needs Walker. If you have any questions about what prescription needs to state please call [facility] on [facility].

## 2012-06-05 NOTE — Telephone Encounter (Signed)
I will drop a paper prescription at the front desk today with all information required.

## 2012-06-12 ENCOUNTER — Ambulatory Visit: Payer: Medicaid Other | Admitting: Family Medicine

## 2012-06-18 ENCOUNTER — Ambulatory Visit: Payer: Medicaid Other | Admitting: Cardiology

## 2012-07-07 ENCOUNTER — Telehealth: Payer: Self-pay | Admitting: Family Medicine

## 2012-07-07 NOTE — Telephone Encounter (Signed)
Pt is coughing a lot and can't even take her nebulizer.  Has appt on Thurs, but wants to talk to nurse about what she can do in the mean time.

## 2012-07-07 NOTE — Telephone Encounter (Signed)
Returned call to patient.  Has been having a dry cough x 2 days.  Denies any fever.  Has shortness of breath at times, but not new symptom due to COPD hx.  Has an appt on Thursday (07/09/12).  Unable to do nebulizer treatment due to mist triggering cough.  Advised patient to try Delsym OTC and call back tomorrow if cough not improved.  Will route to preceptor for additional advice and call patient back.  Gaylene Brooks, RN

## 2012-07-07 NOTE — Telephone Encounter (Signed)
Sounds good. Review red flags. F/u in 2 days

## 2012-07-09 ENCOUNTER — Ambulatory Visit (INDEPENDENT_AMBULATORY_CARE_PROVIDER_SITE_OTHER): Payer: Medicaid Other | Admitting: Family Medicine

## 2012-07-09 ENCOUNTER — Encounter: Payer: Self-pay | Admitting: Family Medicine

## 2012-07-09 VITALS — BP 122/51 | HR 80 | Temp 98.0°F | Ht 65.0 in | Wt 317.2 lb

## 2012-07-09 DIAGNOSIS — Z659 Problem related to unspecified psychosocial circumstances: Secondary | ICD-10-CM

## 2012-07-09 DIAGNOSIS — K529 Noninfective gastroenteritis and colitis, unspecified: Secondary | ICD-10-CM

## 2012-07-09 DIAGNOSIS — Z658 Other specified problems related to psychosocial circumstances: Secondary | ICD-10-CM

## 2012-07-09 DIAGNOSIS — K5289 Other specified noninfective gastroenteritis and colitis: Secondary | ICD-10-CM

## 2012-07-09 DIAGNOSIS — R112 Nausea with vomiting, unspecified: Secondary | ICD-10-CM

## 2012-07-09 MED ORDER — ONDANSETRON 4 MG PO TBDP: 4.0000 mg | ORAL_TABLET | Freq: Three times a day (TID) | ORAL | Status: DC | PRN

## 2012-07-09 NOTE — Patient Instructions (Addendum)
Viral Gastroenteritis Viral gastroenteritis is also known as stomach flu. This condition affects the stomach and intestinal tract. It can cause sudden diarrhea and vomiting. The illness typically lasts 3 to 8 days. Most people develop an immune response that eventually gets rid of the virus. While this natural response develops, the virus can make you quite ill. CAUSES  Many different viruses can cause gastroenteritis, such as rotavirus or noroviruses. You can catch one of these viruses by consuming contaminated food or water. You may also catch a virus by sharing utensils or other personal items with an infected person or by touching a contaminated surface. SYMPTOMS  The most common symptoms are diarrhea and vomiting. These problems can cause a severe loss of body fluids (dehydration) and a body salt (electrolyte) imbalance. Other symptoms may include:  Fever.  Headache.  Fatigue.  Abdominal pain. DIAGNOSIS  Your caregiver can usually diagnose viral gastroenteritis based on your symptoms and a physical exam. A stool sample may also be taken to test for the presence of viruses or other infections. TREATMENT  This illness typically goes away on its own. Treatments are aimed at rehydration. The most serious cases of viral gastroenteritis involve vomiting so severely that you are not able to keep fluids down. In these cases, fluids must be given through an intravenous line (IV). HOME CARE INSTRUCTIONS   Drink enough fluids to keep your urine clear or pale yellow. Drink small amounts of fluids frequently and increase the amounts as tolerated.  Ask your caregiver for specific rehydration instructions.  Avoid:  Foods high in sugar.  Alcohol.  Carbonated drinks.  Tobacco.  Juice.  Caffeine drinks.  Extremely hot or cold fluids.  Fatty, greasy foods.  Too much intake of anything at one time.  Dairy products until 24 to 48 hours after diarrhea stops.  You may consume probiotics.  Probiotics are active cultures of beneficial bacteria. They may lessen the amount and number of diarrheal stools in adults. Probiotics can be found in yogurt with active cultures and in supplements.  Wash your hands well to avoid spreading the virus.  Only take over-the-counter or prescription medicines for pain, discomfort, or fever as directed by your caregiver. Do not give aspirin to children. Antidiarrheal medicines are not recommended.  Ask your caregiver if you should continue to take your regular prescribed and over-the-counter medicines.  Keep all follow-up appointments as directed by your caregiver. SEEK IMMEDIATE MEDICAL CARE IF:   You are unable to keep fluids down.  You do not urinate at least once every 6 to 8 hours.  You develop shortness of breath.  You notice blood in your stool or vomit. This may look like coffee grounds.  You have abdominal pain that increases or is concentrated in one small area (localized).  You have persistent vomiting or diarrhea.  You have a fever. MAKE SURE YOU:   Understand these instructions.  Will watch your condition.  Will get help right away if you are not doing well or get worse. Document Released: 07/22/2005 Document Revised: 10/14/2011 Document Reviewed: 05/08/2011 [facility] Patient Information 2013 [place], [place].

## 2012-07-09 NOTE — Progress Notes (Signed)
Family Medicine Office Visit Note   Subjective:   Patient ID: Kiara Cooper, female  DOB: 03/26/1966, 46 y.o.. MRN: [id number]   Pt that comes today complaining of nausea and vomiting for about 4 days. She reports has been feeling sick and weak initially but since Tuesday she started vomiting up to 2-3 times a day. Has had 1 episode of diarrhea and has been hard to keep anything solid down since yesterday. She is able to tolerate fluids. Denies fevers, chills, headaches, urinary frequency, dysuria or abdominal pain. Does not recall hx of sick contact.   Pt is living at [facility] and was told that her stay will be ending soon. They recommended her to look for housing. Pt is requesting Social Worker help in finding programs that may assist her with housing.  Review of Systems:  Per HPI.   Objective:   Physical Exam: Gen:  NAD HEENT: Moist mucous membranes. Oropharynx: mild erythema, no exudates.  CV: Regular rate and rhythm, no murmurs rubs or gallops PULM: Clear to auscultation bilaterally. No wheezes/rales/rhonchi ABD: Soft, non tender, non distended, normal bowel sounds EXT: No edema Neuro: Alert and oriented x3. No focalization  Assessment & Plan:

## 2012-07-10 DIAGNOSIS — K529 Noninfective gastroenteritis and colitis, unspecified: Secondary | ICD-10-CM | POA: Insufficient documentation

## 2012-07-10 DIAGNOSIS — Z659 Problem related to unspecified psychosocial circumstances: Secondary | ICD-10-CM | POA: Insufficient documentation

## 2012-07-10 NOTE — Assessment & Plan Note (Signed)
Most likely viral etiology. Plan: Symptomatic treatment with Zofran. Encourage to mantain fluid intake.  Letter for work given. Discussed signs of worsening condition that should prompt re-evaluation.

## 2012-07-10 NOTE — Assessment & Plan Note (Signed)
Pt needs assistance with housing. For more detail please refer to HPI. Plan: Will route to our CSW.

## 2012-07-15 ENCOUNTER — Telehealth: Payer: Self-pay | Admitting: Family Medicine

## 2012-07-15 ENCOUNTER — Telehealth: Payer: Self-pay | Admitting: Clinical

## 2012-07-15 NOTE — Telephone Encounter (Signed)
Patient is calling because she is still feeling very sick.  She has taken the meds for nausea and they don't seem to be helping.  She would like to speak to the nurse.

## 2012-07-15 NOTE — Telephone Encounter (Signed)
Clinical Child psychotherapist (CSW) received a referral to assist pt in housing. CSW contacted pt who stated she is currently living at [facility] and is hoping to find her own housing soon. CSW explored when pt time will be up at [facility] and pt stated she has been there a year and should be able to stay an additional 6 months. CSW did inform pt that currently [employer] is not receiving any applications for section 8/housing vouchers as they are on a freeze. CSW encouraged pt to follow-up with her family in [place] to see if they are able to assist with housing when pt ends her time at [facility] CSW also provided pt with contact information for family shelters and encouraged her to get her name on the list asap as these shelters have a long waiting list. Currently pt has safe housing for her and her child and believes she will be able to stay for an additional 6 months. Pt is also working at [employer] and states she is saving her money for housing. Pt also informed CSW that she has been clean for 1 year and has also stopped smoking. CSW praised pt for working so hard toward sobriety and encouraged pt to follow up with CSW. Pt very appreciative.  Theresia Bough, MSW, Theresia Majors [phone]

## 2012-07-15 NOTE — Telephone Encounter (Signed)
Returned call to patient.  Was seen here in office last week.  Nausea not better.  Meds and ginger ale not helping.  Informed patient she can go to urgent care, ED, or wait to be seen in our office tomorrow morning.  Patient prefers appt here.  Scheduled for tomorrow morning with [facility] for 8:45 am.  Gaylene Brooks, RN

## 2012-07-16 ENCOUNTER — Encounter: Payer: Self-pay | Admitting: Family Medicine

## 2012-07-16 ENCOUNTER — Ambulatory Visit (HOSPITAL_COMMUNITY)
Admission: RE | Admit: 2012-07-16 | Discharge: 2012-07-16 | Disposition: A | Discharge status: Home / Self Care | Payer: Medicaid Other | Source: Ambulatory Visit | Attending: Family Medicine | Admitting: Family Medicine

## 2012-07-16 ENCOUNTER — Ambulatory Visit (INDEPENDENT_AMBULATORY_CARE_PROVIDER_SITE_OTHER): Payer: Medicaid Other | Admitting: Family Medicine

## 2012-07-16 ENCOUNTER — Telehealth: Payer: Self-pay | Admitting: Family Medicine

## 2012-07-16 VITALS — BP 114/72 | HR 86 | Temp 97.8°F | Ht 65.0 in | Wt 308.0 lb

## 2012-07-16 DIAGNOSIS — R110 Nausea: Secondary | ICD-10-CM

## 2012-07-16 DIAGNOSIS — R109 Unspecified abdominal pain: Secondary | ICD-10-CM

## 2012-07-16 LAB — COMPLETE METABOLIC PANEL WITH GFR
ALT: 31 U/L (ref 0–35)
AST: 25 U/L (ref 0–37)
Albumin: 4.7 g/dL (ref 3.5–5.2)
BUN: 31 mg/dL — ABNORMAL HIGH (ref 6–23)
CO2: 27 mEq/L (ref 19–32)
Calcium: 10.1 mg/dL (ref 8.4–10.5)
Chloride: 85 mEq/L — ABNORMAL LOW (ref 96–112)
GFR, Est African American: 41 mL/min — ABNORMAL LOW
Potassium: 5.2 mEq/L (ref 3.5–5.3)

## 2012-07-16 LAB — POCT URINALYSIS DIPSTICK
Bilirubin, UA: NEGATIVE
Ketones, UA: NEGATIVE
Leukocytes, UA: NEGATIVE
Spec Grav, UA: <=1.005
pH, UA: 5.5

## 2012-07-16 LAB — CBC WITH DIFFERENTIAL/PLATELET
Basophils Absolute: 0 10*3/uL (ref 0.0–0.1)
Basophils Relative: 1 % (ref 0–1)
Eosinophils Relative: 6 % — ABNORMAL HIGH (ref 0–5)
HCT: 43.7 % (ref 36.0–46.0)
MCHC: 32.7 g/dL (ref 30.0–36.0)
MCV: 82.5 fL (ref 78.0–100.0)
Monocytes Absolute: 0.4 10*3/uL (ref 0.1–1.0)
RDW: 13.4 % (ref 11.5–15.5)

## 2012-07-16 LAB — LIPASE: Lipase: 35 U/L (ref 0–75)

## 2012-07-16 MED ORDER — METOCLOPRAMIDE HCL 10 MG PO TABS: 10.0000 mg | ORAL_TABLET | Freq: Four times a day (QID) | ORAL | Status: DC | PRN

## 2012-07-16 NOTE — Telephone Encounter (Signed)
Called [facility] where patient lives and was able to speak with Sahithi, the person working at the front desk. She woke up Ms. Costanza. I told Ms. Fincher about her elevated glucose. She asked if she could wait until the morning to come to the ED. I told her that it would be much preferable for her to ED now. I then talked with Mitsy who said she would make sure patient went to ED tonight.

## 2012-07-16 NOTE — Progress Notes (Signed)
Patient ID: Syriana Croslin    DOB: 1965-08-15, 46 y.o.   MRN: [id number] --- Subjective:  Tejal is a 46 y.o.female with h/o COPD,  who presents with persistent nausea and weight loss x 1 week. Patient was seen 6 days ago with nausea, vomiting and diarrhea. She had been having symptoms for about 4 days prior to this. Since then, the vomiting and diarrhea have resolved but she continues to have persistent nausea. She reports not having eaten anything solid since last Friday. She has been able to drink water and flat soda. She has not had a bowel movement since Friday and usually has one every day. The nausea occurs with eating. Not associated with abdominal pain, only a feeling of intense nausea.  She weighed 317 pounds on 12/5 and 308 today. Prior to this on 10/25, she weighed 338 pounds, but she admits to have actively worked on loosing weight during that time.  She also reports some body soreness and a mild headache.  She denies any dysuria. She has been urinating every 2-3 hours like she normally does on the HCTZ and lasix. She recently quit the lasix since it made her urinate too much.   ROS: see HPI Past Medical History: reviewed and updated medications and allergies. Social History: Tobacco: not current Alcohol: former alcohol and drug use: quit 1 year ago.   Objective: Filed Vitals:   07/16/12 0827  BP: 114/72  Pulse: 86  Temp: 97.8 F (36.6 C)    Physical Examination:   General appearance - alert, well appearing, and in no distress Mouth - mucous membranes moist, pharynx normal without lesions Neck - supple, no significant adenopathy Chest - clear to auscultation, no wheezes, rales or rhonchi, symmetric air entry Heart - normal rate, regular rhythm, normal S1, S2, no murmurs, rubs, clicks or gallops Abdomen - soft, mild discomfort in epigastric region. Negative Murphy's. No right upper quadrant tenderness. Normal bowe Extremities - peripheral pulses normal, no pedal edema

## 2012-07-16 NOTE — Telephone Encounter (Signed)
Received critical value from lab of glucose of 872 with Creatinine of 1.69.  Called patient on landline and did not get any answer. Called cell phone and got answering machine. Left message stating that patient had an extremely elevated blood sugar as well as renal failure and that it is very important that she go to the emergency room as soon as she gets the message. Instructed patient to call back to the hospital operator and ask to be paged to [facility], if she has any questions.  I tried calling her land line again shortly after and was still not able to get hold of patient.

## 2012-07-16 NOTE — Patient Instructions (Signed)
We are going to check a few labs. I will call you with any abnormal results.  Please stop taking the lasix for the time being. If you can, you should also hold off on the mobic as this can irritate your stomach.  I am sending a prescription for another medicine that could help with the nausea.  Please follow up tomorrow.

## 2012-07-16 NOTE — Telephone Encounter (Signed)
Tried reaching patient's sister: Junie Spencer but number was disconnected.

## 2012-07-17 ENCOUNTER — Other Ambulatory Visit: Payer: Self-pay

## 2012-07-17 ENCOUNTER — Inpatient Hospital Stay (HOSPITAL_COMMUNITY)
Admission: EM | Admit: 2012-07-17 | Discharge: 2012-07-19 | DRG: 683 | Disposition: A | Discharge status: Home / Self Care | Payer: Medicaid Other | Attending: Family Medicine | Admitting: Family Medicine

## 2012-07-17 ENCOUNTER — Encounter (HOSPITAL_COMMUNITY): Payer: Self-pay | Admitting: *Deleted

## 2012-07-17 DIAGNOSIS — I509 Heart failure, unspecified: Secondary | ICD-10-CM | POA: Diagnosis present

## 2012-07-17 DIAGNOSIS — E860 Dehydration: Secondary | ICD-10-CM

## 2012-07-17 DIAGNOSIS — J4489 Other specified chronic obstructive pulmonary disease: Secondary | ICD-10-CM | POA: Diagnosis present

## 2012-07-17 DIAGNOSIS — N179 Acute kidney failure, unspecified: Principal | ICD-10-CM | POA: Diagnosis present

## 2012-07-17 DIAGNOSIS — G4733 Obstructive sleep apnea (adult) (pediatric): Secondary | ICD-10-CM

## 2012-07-17 DIAGNOSIS — I10 Essential (primary) hypertension: Secondary | ICD-10-CM

## 2012-07-17 DIAGNOSIS — E871 Hypo-osmolality and hyponatremia: Secondary | ICD-10-CM | POA: Diagnosis present

## 2012-07-17 DIAGNOSIS — K3184 Gastroparesis: Secondary | ICD-10-CM | POA: Diagnosis present

## 2012-07-17 DIAGNOSIS — Z79899 Other long term (current) drug therapy: Secondary | ICD-10-CM

## 2012-07-17 DIAGNOSIS — G4700 Insomnia, unspecified: Secondary | ICD-10-CM | POA: Diagnosis present

## 2012-07-17 DIAGNOSIS — R109 Unspecified abdominal pain: Secondary | ICD-10-CM | POA: Diagnosis present

## 2012-07-17 DIAGNOSIS — R739 Hyperglycemia, unspecified: Secondary | ICD-10-CM | POA: Diagnosis present

## 2012-07-17 DIAGNOSIS — R42 Dizziness and giddiness: Secondary | ICD-10-CM | POA: Diagnosis present

## 2012-07-17 DIAGNOSIS — Z6841 Body Mass Index (BMI) 40.0 and over, adult: Secondary | ICD-10-CM

## 2012-07-17 DIAGNOSIS — E119 Type 2 diabetes mellitus without complications: Secondary | ICD-10-CM

## 2012-07-17 DIAGNOSIS — J449 Chronic obstructive pulmonary disease, unspecified: Secondary | ICD-10-CM | POA: Diagnosis present

## 2012-07-17 DIAGNOSIS — Z659 Problem related to unspecified psychosocial circumstances: Secondary | ICD-10-CM

## 2012-07-17 LAB — URINE MICROSCOPIC-ADD ON

## 2012-07-17 LAB — URINE CULTURE: Colony Count: 5000

## 2012-07-17 LAB — GLUCOSE, CAPILLARY
Glucose-Capillary: >600 mg/dL (ref 70–99)
Glucose-Capillary: >600 mg/dL (ref 70–99)
Glucose-Capillary: 565 mg/dL (ref 70–99)
Glucose-Capillary: 331 mg/dL — ABNORMAL HIGH (ref 70–99)
Glucose-Capillary: 278 mg/dL — ABNORMAL HIGH (ref 70–99)
Glucose-Capillary: 258 mg/dL — ABNORMAL HIGH (ref 70–99)
Glucose-Capillary: 198 mg/dL — ABNORMAL HIGH (ref 70–99)
Glucose-Capillary: 161 mg/dL — ABNORMAL HIGH (ref 70–99)

## 2012-07-17 LAB — BASIC METABOLIC PANEL
BUN: 26 mg/dL — ABNORMAL HIGH (ref 6–23)
CO2: 26 mEq/L (ref 19–32)
Calcium: 9.7 mg/dL (ref 8.4–10.5)
GFR calc non Af Amer: 61 mL/min — ABNORMAL LOW (ref >90)
Glucose, Bld: 276 mg/dL — ABNORMAL HIGH (ref 70–99)
Potassium: 3.7 mEq/L (ref 3.5–5.1)
Sodium: 137 mEq/L (ref 135–145)

## 2012-07-17 LAB — URINALYSIS, ROUTINE W REFLEX MICROSCOPIC
Hgb urine dipstick: NEGATIVE
Leukocytes, UA: NEGATIVE
Nitrite: NEGATIVE
Specific Gravity, Urine: 1.038 — ABNORMAL HIGH (ref 1.005–1.030)
Urobilinogen, UA: 0.2 mg/dL (ref 0.0–1.0)

## 2012-07-17 LAB — COMPREHENSIVE METABOLIC PANEL
BUN: 32 mg/dL — ABNORMAL HIGH (ref 6–23)
CO2: 26 mEq/L (ref 19–32)
Calcium: 9.5 mg/dL (ref 8.4–10.5)
Creatinine, Ser: 1.37 mg/dL — ABNORMAL HIGH (ref 0.50–1.10)
GFR calc Af Amer: 52 mL/min — ABNORMAL LOW (ref >90)
GFR calc non Af Amer: 45 mL/min — ABNORMAL LOW (ref >90)
Glucose, Bld: 1012 mg/dL (ref 70–99)

## 2012-07-17 LAB — HEMOGLOBIN A1C
Hgb A1c MFr Bld: 15.1 % — ABNORMAL HIGH (ref <5.7)
Mean Plasma Glucose: 387 mg/dL — ABNORMAL HIGH (ref <117)

## 2012-07-17 LAB — CBC WITH DIFFERENTIAL/PLATELET
Basophils Relative: 1 % (ref 0–1)
Hemoglobin: 13.5 g/dL (ref 12.0–15.0)
MCHC: 34.6 g/dL (ref 30.0–36.0)
Monocytes Relative: 8 % (ref 3–12)
Neutro Abs: 3.4 10*3/uL (ref 1.7–7.7)
Neutrophils Relative %: 55 % (ref 43–77)
RBC: 5.07 MIL/uL (ref 3.87–5.11)

## 2012-07-17 MED ORDER — INSULIN ASPART 100 UNIT/ML ~~LOC~~ SOLN
0.0000 [IU] | Freq: Three times a day (TID) | SUBCUTANEOUS | Status: DC
  Administered 2012-07-17: 2 [IU] via SUBCUTANEOUS
  Administered 2012-07-17: 5 [IU] via SUBCUTANEOUS

## 2012-07-17 MED ORDER — SODIUM CHLORIDE 0.9 % IV SOLN
INTRAVENOUS | Status: AC
  Administered 2012-07-17 – 2012-07-18 (×2): via INTRAVENOUS

## 2012-07-17 MED ORDER — LIVING WELL WITH DIABETES BOOK
Freq: Once | Status: DC
  Filled 2012-07-17: qty 1

## 2012-07-17 MED ORDER — SODIUM CHLORIDE 0.9 % IV BOLUS (SEPSIS)
1000.0000 mL | Freq: Once | INTRAVENOUS | Status: AC
  Administered 2012-07-17: 1000 mL via INTRAVENOUS

## 2012-07-17 MED ORDER — INSULIN ASPART 100 UNIT/ML ~~LOC~~ SOLN
10.0000 [IU] | Freq: Once | SUBCUTANEOUS | Status: AC
  Administered 2012-07-17: 10 [IU] via SUBCUTANEOUS

## 2012-07-17 MED ORDER — HEPARIN SODIUM (PORCINE) 5000 UNIT/ML IJ SOLN
5000.0000 [IU] | Freq: Three times a day (TID) | INTRAMUSCULAR | Status: DC
  Administered 2012-07-17 – 2012-07-19 (×6): 5000 [IU] via SUBCUTANEOUS
  Filled 2012-07-17 (×9): qty 1

## 2012-07-17 MED ORDER — IPRATROPIUM BROMIDE 0.02 % IN SOLN
0.5000 mg | RESPIRATORY_TRACT | Status: DC
  Administered 2012-07-17 (×2): 0.5 mg via RESPIRATORY_TRACT
  Filled 2012-07-17 (×2): qty 2.5

## 2012-07-17 MED ORDER — POTASSIUM CHLORIDE 10 MEQ/100ML IV SOLN
10.0000 meq | Freq: Once | INTRAVENOUS | Status: AC
  Administered 2012-07-17: 10 meq via INTRAVENOUS
  Filled 2012-07-17: qty 100

## 2012-07-17 MED ORDER — ALBUTEROL SULFATE (5 MG/ML) 0.5% IN NEBU: 2.5000 mg | INHALATION_SOLUTION | RESPIRATORY_TRACT | Status: DC

## 2012-07-17 MED ORDER — METFORMIN HCL 500 MG PO TABS: 500.0000 mg | ORAL_TABLET | Freq: Two times a day (BID) | ORAL | Status: DC

## 2012-07-17 MED ORDER — DEXTROSE 50 % IV SOLN: 25.0000 mL | INTRAVENOUS | Status: DC | PRN

## 2012-07-17 MED ORDER — INSULIN GLARGINE 100 UNIT/ML ~~LOC~~ SOLN
10.0000 [IU] | Freq: Every day | SUBCUTANEOUS | Status: DC
  Administered 2012-07-17: 10 [IU] via SUBCUTANEOUS

## 2012-07-17 MED ORDER — IPRATROPIUM BROMIDE 0.02 % IN SOLN
0.5000 mg | Freq: Four times a day (QID) | RESPIRATORY_TRACT | Status: DC
  Administered 2012-07-17 – 2012-07-19 (×5): 0.5 mg via RESPIRATORY_TRACT
  Filled 2012-07-17 (×5): qty 2.5

## 2012-07-17 MED ORDER — BD GETTING STARTED TAKE HOME KIT: 1/2ML X 30G SYRINGES: 1.0000 | Freq: Once | Status: DC

## 2012-07-17 MED ORDER — DEXTROSE-NACL 5-0.45 % IV SOLN: INTRAVENOUS | Status: DC

## 2012-07-17 MED ORDER — ALBUTEROL SULFATE (5 MG/ML) 0.5% IN NEBU
2.5000 mg | INHALATION_SOLUTION | RESPIRATORY_TRACT | Status: DC
  Administered 2012-07-17 (×2): 2.5 mg via RESPIRATORY_TRACT
  Filled 2012-07-17 (×2): qty 0.5

## 2012-07-17 MED ORDER — BD GETTING STARTED TAKE HOME KIT: 1/2ML X 30G SYRINGES
1.0000 | Freq: Once | Status: DC
  Filled 2012-07-17: qty 1

## 2012-07-17 MED ORDER — SODIUM CHLORIDE 0.9 % IV SOLN
INTRAVENOUS | Status: DC
  Administered 2012-07-17 (×3): via INTRAVENOUS

## 2012-07-17 MED ORDER — INSULIN REGULAR HUMAN 100 UNIT/ML IJ SOLN
INTRAMUSCULAR | Status: DC
  Administered 2012-07-17: 15.2 [IU]/h via INTRAVENOUS
  Administered 2012-07-17: 10.8 [IU]/h via INTRAVENOUS
  Administered 2012-07-17: 5.4 [IU]/h via INTRAVENOUS
  Filled 2012-07-17 (×2): qty 1

## 2012-07-17 MED ORDER — ALBUTEROL SULFATE (5 MG/ML) 0.5% IN NEBU
2.5000 mg | INHALATION_SOLUTION | Freq: Four times a day (QID) | RESPIRATORY_TRACT | Status: DC
  Administered 2012-07-17 – 2012-07-19 (×5): 2.5 mg via RESPIRATORY_TRACT
  Filled 2012-07-17 (×5): qty 0.5

## 2012-07-17 MED ORDER — METOCLOPRAMIDE HCL 10 MG PO TABS
10.0000 mg | ORAL_TABLET | Freq: Three times a day (TID) | ORAL | Status: DC
  Administered 2012-07-17 – 2012-07-18 (×8): 10 mg via ORAL
  Filled 2012-07-17 (×13): qty 1

## 2012-07-17 MED ORDER — METRONIDAZOLE 500 MG PO TABS
500.0000 mg | ORAL_TABLET | Freq: Two times a day (BID) | ORAL | Status: DC
  Administered 2012-07-17 – 2012-07-19 (×4): 500 mg via ORAL
  Filled 2012-07-17 (×6): qty 1

## 2012-07-17 MED ORDER — ONDANSETRON 4 MG PO TBDP
4.0000 mg | ORAL_TABLET | Freq: Three times a day (TID) | ORAL | Status: DC | PRN
  Filled 2012-07-17: qty 1

## 2012-07-17 MED ORDER — INSULIN REGULAR BOLUS VIA INFUSION
0.0000 [IU] | Freq: Three times a day (TID) | INTRAVENOUS | Status: DC
  Filled 2012-07-17: qty 10

## 2012-07-17 MED ORDER — ONDANSETRON HCL 4 MG/2ML IJ SOLN: 4.0000 mg | Freq: Three times a day (TID) | INTRAMUSCULAR | Status: AC | PRN

## 2012-07-17 MED ORDER — IPRATROPIUM BROMIDE 0.02 % IN SOLN: 0.5000 mg | Freq: Four times a day (QID) | RESPIRATORY_TRACT | Status: DC

## 2012-07-17 MED ORDER — FLUTICASONE PROPIONATE HFA 44 MCG/ACT IN AERO
1.0000 | INHALATION_SPRAY | Freq: Two times a day (BID) | RESPIRATORY_TRACT | Status: DC
  Administered 2012-07-17 – 2012-07-19 (×5): 1 via RESPIRATORY_TRACT
  Filled 2012-07-17: qty 10.6

## 2012-07-17 NOTE — Assessment & Plan Note (Addendum)
Concerning given that it has been persistent with decreased food intake and significant weight loss in a short period of time. No evidence of small bowel obstruction. Constipation could be an explanation as well. Pancreatitis part of the differential. No recent h/o alcohol use although she does have a remote history 1 year ago. Could be a component of gastroparesis although patient doesn't have a diagnosis of diabetes. If renal failure may be playing a role in her nausea, also recommended that patient hold off lasix and mobic for now until labs return.  Checking CMP, lipase, UA, UCx. Will also check KUB to assess for stool burden or any element of SBO.  Patient able to hydrate by mouth and keep fluids down.

## 2012-07-17 NOTE — Care Management Note (Signed)
    Page 1 of 1   07/17/2012     9:49:24 AM   CARE MANAGEMENT NOTE 07/17/2012  Patient:  Kiara Cooper,Kiara Cooper   Account Number:  [id number]  Date Initiated:  07/17/2012  Documentation initiated by:  Junius Creamer  Subjective/Objective Assessment:   adm w hyperglycemia     Action/Plan:   lives at shelter   Anticipated DC Date:     Anticipated DC Plan:    In-house referral  Clinical Social Worker      DC Associate Professor  CM consult      Choice offered to / List presented to:             Status of service:   Medicare Important Message given?   (If response is "NO", the following Medicare IM given date fields will be blank) Date Medicare IM given:   Date Additional Medicare IM given:    Discharge Disposition:  HOME/SELF CARE  Per UR Regulation:  Reviewed for med. necessity/level of care/duration of stay  If discussed at Long Length of Stay Meetings, dates discussed:    Comments:  12/13 9:45a debbie  Gaspar Fowle rn,bsn [phone]

## 2012-07-17 NOTE — H&P (Signed)
Family Medicine Teaching [facility] Admission History and Physical Service Pager: [phone]  Patient name: Kiara Cooper Medical record number: [id number] Date of birth: 1966-04-16 Age: 46 y.o. Gender: female  Primary Care Provider: Lillia Abed, MD  Chief Complaint: Nausea, vomiting and abdominal pain  History of Present Illness: Kiara Cooper is a 46 y.o. year old female with a history of HTN, COPD, Sleep apnea with a new onset diabetes in hyperglycemic state. She had an appointment in [facility] today, with Dr. Gwenlyn Saran, for persistent nausea and vomiting with 9 pound unintentional weight loss in a week. She was found to have glucose >800 and Cr 1.6. She endorses some visual changes of blurriness. She was called to come in to the hospital to be admitted. She complains tonight of abdominal pain mid-epigastric region. She had a BM this evening of dark brown stool, after not having had one in one week. She denies bloody stools. She had noticed an increased frequency in urination >3/h without pain. She did not take the lasix today because she thought it was causing her frequency. She was told to hold off on the mobic as well which she did. Last dose was one day ago.   In the ED, she was found to have a glucose of greater than 1000 with >1000 glucose in urine. She received 2L NS and was started on insulin drip.  Patient Active Problem List  Diagnosis  . Shortness of breath  . Obesity  . Hx of cocaine abuse  . H/O alcohol abuse  . HTN (hypertension)  . COPD, moderate  . Severe obstructive sleep apnea  . Left knee pain  . Poor social situation  . Nausea  . Diabetes mellitus, new onset   Past Medical History: Past Medical History  Diagnosis Date  . Hypertension   . Arthritis   . Edema   . 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 No orthopnea. Epworth Sleepiness Scale is 10.  Plan: Polysomnography studies.    Marland Kitchen COPD  (chronic obstructive pulmonary disease)   . CHF (congestive heart failure)    Past Surgical History: Past Surgical History  Procedure Date  . Hernia repair     Umbilical  . Cesarean section w/btl   . Tubal ligation    Social History: History  Substance Use Topics  . Smoking status: Former Smoker -- 2.0 packs/day for 30 years    Types: Cigarettes    Quit date: 09/12/2011  . Smokeless tobacco: Never Used  . Alcohol Use: No     Comment: off alcohol since 08/22/11 -every day about 24 pack a day   For any additional social history documentation, please refer to relevant sections of EMR.  Family History: Family History  Problem Relation Age of Onset  . Diabetes Father   . Stroke Mother   . Coronary artery disease Maternal Aunt 77  . Coronary artery disease Maternal Grandfather 89   Allergies: No Known Allergies No current facility-administered medications on file prior to encounter.   Current Outpatient Prescriptions on File Prior to Encounter  Medication Sig Dispense Refill  . amLODipine-valsartan (EXFORGE) 10-320 MG per tablet Take 1 tablet by mouth daily.      . beclomethasone (QVAR) 80 MCG/ACT inhaler Inhale 1 puff into the lungs 2 (two) times daily.      . furosemide (LASIX) 20 MG tablet Take 1 tablet (20 mg total) by mouth daily.  30 tablet  3  . ipratropium-albuterol (DUONEB) 0.5-2.5 (3) MG/3ML SOLN Take 3 mLs by nebulization every 6 (six) hours as needed. For wheezing or shortness of breath      . meloxicam (MOBIC) 15 MG tablet Take 1 tablet (15 mg total) by mouth daily.  30 tablet  3   Review Of Systems: Per HPI  Otherwise 12 point review of systems was performed and was unremarkable.  Physical Exam: BP 97/80  Pulse 75  Temp 97.6 F (36.4 C) (Oral)  Resp 15  Ht 5\' 5"  (1.651 m)  Wt 300 lb 7.8 oz (136.3 kg)  BMI 50.00 kg/m2  SpO2 99%  LMP 06/29/2012 Exam: General: NAD. Obese african [place] female. Sleeping comfortably, awakens easily to name.  HEENT:  [place].AT. PERLA. EOMi. Mildly dry mucous membranes. Cardiovascular: Distant heart sounds, RRR. No murmur. Respiratory: CTAB> No wheezing, rhonchi or rubs.  Abdomen: Obese, soft, diffusely tender. Extremities: ROM wnl. +2/4 LE pulses. Trace edema right leg. No erythema and NT. Skin: Dry and warm Neuro: AOx4, Grossly intact, no focal deficits.   Labs and Imaging: CBC BMET   Lab 07/17/12 0034  WBC 6.3  HGB 13.5  HCT 39.0  PLT 266    Lab 07/17/12 0034  NA 121*  K 4.4  CL 82*  CO2 26  BUN 32*  CREATININE 1.37*  GLUCOSE 1012*  CALCIUM 9.5     Lab Results  Component Value Date   LIPASE 31 07/17/2012   Urinalysis    Component Value Date/Time   COLORURINE YELLOW 07/17/2012 0020   APPEARANCEUR CLEAR 07/17/2012 0020   LABSPEC 1.038* 07/17/2012 0020   PHURINE 6.0 07/17/2012 0020   GLUCOSEU >1000* 07/17/2012 0020   HGBUR NEGATIVE 07/17/2012 0020   BILIRUBINUR NEGATIVE 07/17/2012 0020   BILIRUBINUR NEG 07/16/2012 0910   KETONESUR NEGATIVE 07/17/2012 0020   PROTEINUR NEGATIVE 07/17/2012 0020   UROBILINOGEN 0.2 07/17/2012 0020   UROBILINOGEN 0.2 07/16/2012 0910   NITRITE NEGATIVE 07/17/2012 0020   NITRITE NEG 07/16/2012 0910   LEUKOCYTESUR NEGATIVE 07/17/2012 0020   Dg Abd 1 View  07/16/2012  *RADIOLOGY REPORT*  IMPRESSION: No acute abnormality is noted.   Original Report Authenticated By: Alcide Clever, M.D.      Assessment and Plan: Kiara Cooper is a 46 y.o. year old female year old female with a history of HTN, COPD, Sleep apnea with a new onset diabetes in hyperglycemic state. Her glucose on admission was >1000 in serum and urine. Her creatine was 1.6 and improved to 1.37. She has a calculated serum osmo of 309. Gluco-stabilzer was initiated admission.  Hyperglycemia: no anion gap and no urine ketones making type 1 less likely. Serum osmo 309 ( <350) and no altered mental state, thus hyperglycemic not hyperosmolar event  - New onset diabetes mellitus, likely type 2 given morbid obesity -  Glucostabilzer with NS at 150cc/hr. No evidence of heart failure allowing for more liberal fluid repletion.  - BMP every 2 hours - Replete potassium as needed - Diabetic education - A1C, TSH  Nausea and abdominal discomfort:  - likely from gastroparesis. KUB obtained earlier today did not show evidence of SBO or increased stool burden.  - start reglan scheduled and zofran as needed.   Acute Renal Failure: baseline Cr: 0.9. BUN/Cr>20 making prerenal etiology highly likely, especially in context of dehydration.  - continue IV fluids - f/u BMP - hold on potential nephrotoxic agents.   HTN: - HCTZ, amlodipine, lasix and valsartan are being held given BP in 100's systolic. Hold ARB and  thiazide in context of acute renal failure.   Sleep apnea: - CPAP HS  COPD: - QVAR - duo neb PRN  FENGI: - Glucostabilzer - NPO- ice chips - Reglan, zofran - NS at 150cc/hr, will switch to D5 1/2NS once glucose<250  Proph: SQH Code: full Dispo: Stabilization of glucose and diabetes education. Patient lives in a homeless shelter: [facility].     Felix Pacini, DO 07/17/2012, 5:35 AM  Saw and examined patient with Dr. Claiborne Billings. Agree with plan and assessment above with highlighted additions.   Marena Chancy, PGY-2 Family Medicine Resident [phone]

## 2012-07-17 NOTE — Plan of Care (Signed)
Problem: Food- and Nutrition-Related Knowledge Deficit (NB-1.1) Goal: Nutrition education Formal process to instruct or train a patient/client in a skill or to impart knowledge to help patients/clients voluntarily manage or modify food choices and eating behavior to maintain or improve health.  Outcome: Completed/Met Date Met:  07/17/12  RD consulted for nutrition education regarding new onset diabetes.     Lab Results  Component Value Date    HGBA1C 15.1* 07/17/2012    RD provided "Carbohydrate Counting for People with Diabetes" handout from the Academy of Nutrition and Dietetics. Discussed different food groups and their effects on blood sugar, emphasizing carbohydrate-containing foods. Provided list of carbohydrates and recommended serving sizes of common foods.  Discussed importance of controlled and consistent carbohydrate intake throughout the day. Provided examples of ways to balance meals/snacks and encouraged intake of high-fiber, whole grain complex carbohydrates. Teach back method used.  Expect fair compliance.  Body mass index is 50.00 kg/(m^2). Pt meets criteria for obesity class 3 based on current BMI.  Current diet order is carb mod medium, patient is consuming approximately 30% of meals at this time. Labs and medications reviewed. No further nutrition interventions warranted at this time. RD contact information provided. If additional nutrition issues arise, please re-consult RD.  Clarene Duke RD, LDN Pager [phone] After Hours pager [phone]

## 2012-07-17 NOTE — ED Provider Notes (Addendum)
History     CSN: [id number]  Arrival date & time 07/17/12  0008   First MD Initiated Contact with Patient 07/17/12 0015      Chief Complaint  Patient presents with  . Hyperglycemia    (Consider location/radiation/quality/duration/timing/severity/associated sxs/prior treatment) HPI Comments: 46 year old female with a history of hypertension, obesity and congestive heart failure with COPD who presents with a complaint of new onset high blood sugar. She presented to her family Dr. today because of increased generalized weakness, urinary frequency, excessive thirst and was called this evening on the ventilator no that her blood sugar was over 800. The patient states that her symptoms are persistent, gradually worsening, nothing seems to make it better, she gets worse when she stands up because she gets lightheaded and starts to see black spots. She denies fever, chills, redness of breath, cough, abdominal pain, swelling, diarrhea. She does admit to having mild nausea  The history is provided by the patient.    Past Medical History  Diagnosis Date  . Hypertension   . Arthritis   . Edema   . 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 No orthopnea. Epworth Sleepiness Scale is 10.  Plan: Polysomnography studies.    Marland Kitchen COPD (chronic obstructive pulmonary disease)   . CHF (congestive heart failure)     Past Surgical History  Procedure Date  . Hernia repair     Umbilical  . Cesarean section w/btl   . Tubal ligation     Family History  Problem Relation Age of Onset  . Diabetes Father   . Stroke Mother   . Coronary artery disease Maternal Aunt 77  . Coronary artery disease Maternal Grandfather 89    History  Substance Use Topics  . Smoking status: Former Smoker -- 2.0 packs/day for 30 years    Types: Cigarettes    Quit date: 09/12/2011  . Smokeless tobacco: Never Used  . 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    Allergies  Review of patient's allergies indicates no known allergies.  Home Medications   Current Outpatient Rx  Name  Route  Sig  Dispense  Refill  . AMLODIPINE BESYLATE-VALSARTAN 10-320 MG PO TABS   Oral   Take 1 tablet by mouth daily.         . BECLOMETHASONE DIPROPIONATE 80 MCG/ACT IN AERS   Inhalation   Inhale 1 puff into the lungs 2 (two) times daily.         . FUROSEMIDE 20 MG PO TABS   Oral   Take 1 tablet (20 mg total) by mouth daily.   30 tablet   3   . HYDROCHLOROTHIAZIDE 25 MG PO TABS   Oral   Take 25 mg by mouth daily.         . IPRATROPIUM-ALBUTEROL 0.5-2.5 (3) MG/3ML IN SOLN   Nebulization   Take 3 mLs by nebulization every 6 (six) hours as needed. For wheezing or shortness of breath         . MELOXICAM 15 MG PO TABS   Oral   Take  1 tablet (15 mg total) by mouth daily.   30 tablet   3   . ONDANSETRON 4 MG PO TBDP   Oral   Take 4 mg by mouth every 8 (eight) hours as needed. For nausea           BP 110/82  Pulse 78  Temp 98.3 F (36.8 C) (Oral)  Resp 13  SpO2 96%  LMP 06/29/2012  Physical Exam  Nursing note and vitals reviewed. Constitutional: She appears well-developed and well-nourished. No distress.  HENT:  Head: Normocephalic and atraumatic.  Mouth/Throat: Oropharynx is clear and moist. No oropharyngeal exudate.       Mucous membranes mildly dehydrated  Eyes: Conjunctivae normal and EOM are normal. Pupils are equal, round, and reactive to light. Right eye exhibits no discharge. Left eye exhibits no discharge. No scleral icterus.  Neck: Normal range of motion. Neck supple. No JVD present. No thyromegaly present.  Cardiovascular: Normal rate, regular rhythm, normal heart sounds and intact distal pulses.  Exam  reveals no gallop and no friction rub.   No murmur heard. Pulmonary/Chest: Effort normal and breath sounds normal. No respiratory distress. She has no wheezes. She has no rales.  Abdominal: Soft. Bowel sounds are normal. She exhibits no distension and no mass. There is no tenderness.       Obese, nontender  Musculoskeletal: Normal range of motion. She exhibits no edema and no tenderness.  Lymphadenopathy:    She has no cervical adenopathy.  Neurological: She is alert. Coordination normal.  Skin: Skin is warm and dry. No rash noted. No erythema.  Psychiatric: She has a normal mood and affect. Her behavior is normal.    ED Course  Procedures (including critical care time)  Labs Reviewed  COMPREHENSIVE METABOLIC PANEL - Abnormal; Notable for the following:    Sodium 121 (*)     Chloride 82 (*)     Glucose, Bld 1012 (*)     BUN 32 (*)     Creatinine, Ser 1.37 (*)     Alkaline Phosphatase 140 (*)     GFR calc non Af Amer 45 (*)     GFR calc Af Amer 52 (*)     All other components within normal limits  CBC WITH DIFFERENTIAL - Abnormal; Notable for the following:    MCV 76.9 (*)  CORRECTED FOR INTERFERING SUBSTANCE   Eosinophils Relative 6 (*)     All other components within normal limits  URINALYSIS, ROUTINE W REFLEX MICROSCOPIC - Abnormal; Notable for the following:    Specific Gravity, Urine 1.038 (*)     Glucose, UA >1000 (*)     All other components within normal limits  GLUCOSE, CAPILLARY - Abnormal; Notable for the following:    Glucose-Capillary >600 (*)     All other components within normal limits  GLUCOSE, CAPILLARY - Abnormal; Notable for the following:    Glucose-Capillary >600 (*)     All other components within normal limits  LIPASE, BLOOD  URINE MICROSCOPIC-ADD ON   Dg Abd 1 View  07/16/2012  *RADIOLOGY REPORT*  Clinical Data: Nausea  ABDOMEN - 1 VIEW  Comparison: None.  Findings: Scattered large and small bowel gas is identified. Findings of prior tubal  ligation are seen.  No abnormal mass or abnormal calcifications are noted.  Degenerative changes of the lumbar spine are seen.  IMPRESSION: No acute abnormality is noted.   Original Report Authenticated By: Alcide Clever, M.D.    ED ECG REPORT  I  personally interpreted this EKG   Date: 07/17/2012   Rate: 80  Rhythm: normal sinus rhythm  QRS Axis: normal  Intervals: normal  ST/T Wave abnormalities: normal  Conduction Disutrbances:none  Narrative Interpretation: Poor R-wave progression  Old EKG Reviewed: Compared with 05/22/2012, no significant changes    1. Hyperglycemia   2. Hyponatremia   3. Dehydration   4. Diabetes mellitus, new onset       MDM  Though the patient has normal vital signs she does appear dehydrated and clinically she appears to be volume depleted which would be consistent with her new onset hyperglycemia. Recheck blood sugar here is unmeasurable he had had a CBG, labs ordered, fluids ordered, insulin, anticipate admission for new-onset diabetes with severe hyperglycemia. Her mental status is normal.   The patient's labs have been reviewed, the blood sugar is over 1000, she is significantly hyponatremic but this correct somewhat given her blood sugar. She does not have a significant anion gap, she does not have ketonuria, I suspect that she has severe hyperglycemia without DKA. Insulin drip has been started, discussed with family practice resident at [facility] who has accepted transfer of the patient.     Vida Roller, MD 07/17/12 0149  Vida Roller, MD 07/17/12 [phone]

## 2012-07-17 NOTE — H&P (Signed)
FMTS Attending Admission Note: Renold Don MD Personal pager:  [phone] FPTS Service Pager:  [phone]  I  have seen and examined this patient, reviewed their chart. I have discussed this patient with the resident. I agree with the resident's findings, assessment and care plan.  Additionally: 46 yo F newly diagnosed diabetic.   - Agree with insulin drip, fluids, NPO until CBGs <250.  - Watch K+ and kidney function  - Will need social work consult for poor home situation - Diabetic teaching - Likely can be transferred from step-down later today.

## 2012-07-17 NOTE — Progress Notes (Signed)
Trichomonas noted in UA.  Will treat with flagyl 500 mg BID, as I'm not sure she will tolerate 2 gram dose due to current nausea.

## 2012-07-17 NOTE — Discharge Summary (Addendum)
Physician Discharge Summary  Patient ID: Kiara Cooper MRN: [id number] DOB/AGE: 1966/01/20 46 y.o.  Admit date: 07/17/2012 Discharge date: 07/19/2012  Admission Diagnoses: Hyperglycemia  Discharge Diagnoses:  Active Problems:  Diabetes mellitus, new onset  Hyperglycemia  Hyponatremia  Dehydration   Discharged Condition: good  Hospital Course:  Kiara Cooper is a 46 y.o. year old female with a history of HTN, COPD, Sleep apnea with a new onset diabetes in hyperglycemic state. She was seen in clinic on the morning of 12/12 for nausea and vomiting with abdominal pain. She received a KUB, which was normal, BMP and UA. Both of these returned with glucose >800. Patient was contacted at home and advised to come through the ER to be admitted.  Her glucose on admission was >1000 in serum and urine. She was placed on the gluco-stabilizer on admission. Her creatine was 1.6 and continued to improve to baseline with correction of glucose. She has a calculated serum osmo of 309, AG 13. Gluco-stabilzer was initiated admission and completed. Patient was able to administer her own insulin.  Hyperglycemia: Her Serum osmo 309 and she was not altered on admission, thus hyperglycemic not hyperosmolar event. New onset diabetes mellitus, likely type 2 given morbid obesity. Glucostabilzer was initiated until glucose below 250, she then was transitioned to SSI- sensitive and Lantus. Potassium was closely watched and replaced as needed. Diabetic education was ordered on admission.   Nausea and abdominal discomfort: likely from gastroparesis. KUB obtained did not show evidence of SBO or increased stool burden. Reglan and zofran  was available as needed.  Acute Renal Failure: baseline Cr: 0.9. BUN/Cr>20 making prerenal etiology highly likely, especially in context of dehydration. IV fluids were administered for correction of dehydration.   Patient was discharged on glipizide 5 mg and lantus 15 mg. As outpatient,  considering new diagnosis of diabetes, may need to reconsider her current diuretic therapy for HTN. Told to check CBG TID. Consults: Diabetic education  Significant Diagnostic Studies: AIC: 15.1 TSH: 2.2 (WNL) Lipase: WNL  Treatments: IV hydration and insulin: Humalog and Lantus  Discharge Exam: Blood pressure 110/87, pulse 77, temperature 98.4 F (36.9 C), temperature source Oral, resp. rate 18, height 5\' 5"  (1.651 m), weight 300 lb 7.8 oz (136.3 kg), last menstrual period 06/29/2012, SpO2 98.00%. Physical Exam:  Gen: NAD. Sitting up in bed.  HENT: mucous membranes still appear mildly dry.  CV: RRR. No murmur.  Lungs: CTAB. No wheezing, rhonchi or rales.  Abd: Soft. NT.ND. No HSM  EXT: NT. No erythema. trace edema.   Disposition: 01-Home or Self Care      Discharge Orders    Future Orders Please Complete By Expires   For home use only DME Glucometer      Ambulatory Referral to DSME/T      Comments:   Patient with new onset diabetes.  A1c 15.1% (07/17/12).   Questions: Responses:   Check all special needs that apply to patient requiring 1 on 1 DSME/T Economic/Supplies   DSME/T Content Diabetes as disease process    Goal setting, problem solving    Medications    Monitoring Diabetes    Nutritional management    Physical activity    Prevent, detect and treat acute complications    Prevent, detect and treat chronic complications    Psychological adjustment   Complications/Comorbidities Hypertension    Obesity   Choose type of training services and number of hours requested Initial DSME/T:  10 hours   Diet Carb Modified  Comments:   Attempt to follow a diabetic diet. A sample has been included in your discharge packet.   Discharge instructions      Comments:   Please make sure you follow up with family practice within a week. If you have any questions or feel ill, please call the clinic or go to the ED.   Call MD for:  persistant nausea and vomiting      Call MD  for:  persistant dizziness or light-headedness          Medication List     As of 07/19/2012  6:38 AM    STOP taking these medications         hydrochlorothiazide 25 MG tablet   Commonly known as: HYDRODIURIL      TAKE these medications         amLODipine-valsartan 10-320 MG per tablet   Commonly known as: EXFORGE   Take 1 tablet by mouth daily.      bd getting started take home kit Misc   1 kit by Other route once.      beclomethasone 80 MCG/ACT inhaler   Commonly known as: QVAR   Inhale 1 puff into the lungs 2 (two) times daily.      furosemide 20 MG tablet   Commonly known as: LASIX   Take 1 tablet (20 mg total) by mouth daily.      glipiZIDE 5 MG tablet   Commonly known as: GLUCOTROL   Take 1 tablet (5 mg total) by mouth 2 (two) times daily before a meal.      glucose blood test strip   Use as instructed      insulin glargine 100 UNIT/ML injection   Commonly known as: LANTUS   Inject 15 Units into the skin at bedtime.      ipratropium-albuterol 0.5-2.5 (3) MG/3ML Soln   Commonly known as: DUONEB   Take 3 mLs by nebulization every 6 (six) hours as needed. For wheezing or shortness of breath      meloxicam 15 MG tablet   Commonly known as: MOBIC   Take 1 tablet (15 mg total) by mouth daily.      metFORMIN 500 MG tablet   Commonly known as: GLUCOPHAGE   Take 1 tablet (500 mg total) by mouth 2 (two) times daily with a meal.      ondansetron 4 MG disintegrating tablet   Commonly known as: ZOFRAN-ODT   Take 4 mg by mouth every 8 (eight) hours as needed. For nausea         Follow-up Information    Follow up with Lillia Abed, MD. Schedule an appointment as soon as possible for a visit in 1 week.   Contact information:   [address] [place] [address] [phone]          Signed: Felix Pacini 07/19/2012, 6:38 AM

## 2012-07-17 NOTE — ED Notes (Signed)
[id number]<AV> Expected date:<BR> Expected time:<BR> Means of arrival:<BR> Comments:<BR> EMS/CBG high-no hx Diabetes-hx recent diagnosis of COPD

## 2012-07-17 NOTE — Progress Notes (Addendum)
Patient admitted through ED with hyperglycemia.  Glucose 1012 mg/dl on admit.  Not in ketosis.  Diagnosed with new onset diabetes.  Spoke with pt about new diagnosis.  Discussed A1C results with her and explained what an A1C is, basic pathophysiology of DM Type 2, basic home care, importance of checking CBGs and maintaining good CBG control to prevent long-term and short-term complications.  Reviewed signs and symptoms of hyperglycemia and hypoglycemia.  RNs to provide ongoing basic DM education at bedside with this patient.  Have ordered educational booklet, RD consult for diet education, and DM videos.  Patient told me her father died from diabetes when she was a younger girl.  She also told me her mother is still alive and she has 2 children.  Patient was very receptive to all the information.  In addition to the Living Well with DM book, gave patient several other educational pamphlets on diabetes, carbohydrate counting, hypoglycemia, and also gave patient a blood sugar log book.  MD- Please make sure patient gets all appropriate prescriptions at discharge.  She will need a Rx for a blood glucose meter too.  Will follow. Ambrose Finland RN, MSN, CDE Diabetes Coordinator Inpatient Diabetes Program [phone]

## 2012-07-17 NOTE — ED Notes (Signed)
Per ems report, pt physician called her tonight to tell her that her sugar level is high ( 800's) and to come to the ER. Pt denies CP, has had shortness of breath (recent dx of COPD) Pt has had increased urination and thirst.

## 2012-07-18 DIAGNOSIS — E871 Hypo-osmolality and hyponatremia: Secondary | ICD-10-CM | POA: Diagnosis present

## 2012-07-18 DIAGNOSIS — R739 Hyperglycemia, unspecified: Secondary | ICD-10-CM | POA: Diagnosis present

## 2012-07-18 DIAGNOSIS — E860 Dehydration: Secondary | ICD-10-CM | POA: Diagnosis present

## 2012-07-18 LAB — GLUCOSE, CAPILLARY
Glucose-Capillary: 374 mg/dL — ABNORMAL HIGH (ref 70–99)
Glucose-Capillary: 332 mg/dL — ABNORMAL HIGH (ref 70–99)

## 2012-07-18 LAB — BASIC METABOLIC PANEL
BUN: 22 mg/dL (ref 6–23)
CO2: 22 mEq/L (ref 19–32)
Calcium: 8.5 mg/dL (ref 8.4–10.5)
Creatinine, Ser: 0.99 mg/dL (ref 0.50–1.10)
Glucose, Bld: 420 mg/dL — ABNORMAL HIGH (ref 70–99)

## 2012-07-18 MED ORDER — GLUCOSE BLOOD VI STRP: ORAL_STRIP | Status: DC

## 2012-07-18 MED ORDER — INSULIN ASPART 100 UNIT/ML ~~LOC~~ SOLN
10.0000 [IU] | Freq: Once | SUBCUTANEOUS | Status: AC
  Administered 2012-07-18: 10 [IU] via SUBCUTANEOUS

## 2012-07-18 MED ORDER — GLIPIZIDE 5 MG PO TABS: 5.0000 mg | ORAL_TABLET | Freq: Two times a day (BID) | ORAL | Status: DC

## 2012-07-18 MED ORDER — INSULIN GLARGINE 100 UNIT/ML ~~LOC~~ SOLN
15.0000 [IU] | Freq: Every day | SUBCUTANEOUS | Status: DC
  Administered 2012-07-18: 15 [IU] via SUBCUTANEOUS

## 2012-07-18 MED ORDER — ACETAMINOPHEN 325 MG PO TABS
650.0000 mg | ORAL_TABLET | Freq: Four times a day (QID) | ORAL | Status: DC | PRN
  Administered 2012-07-18: 650 mg via ORAL
  Filled 2012-07-18: qty 2

## 2012-07-18 MED ORDER — INSULIN ASPART 100 UNIT/ML ~~LOC~~ SOLN
0.0000 [IU] | SUBCUTANEOUS | Status: DC
  Administered 2012-07-18 (×3): 7 [IU] via SUBCUTANEOUS
  Administered 2012-07-18: 9 [IU] via SUBCUTANEOUS
  Administered 2012-07-19: 7 [IU] via SUBCUTANEOUS
  Administered 2012-07-19: 5 [IU] via SUBCUTANEOUS
  Administered 2012-07-19: 3 [IU] via SUBCUTANEOUS

## 2012-07-18 MED ORDER — INSULIN GLARGINE 100 UNIT/ML ~~LOC~~ SOLN: 15.0000 [IU] | Freq: Every day | SUBCUTANEOUS | Status: DC

## 2012-07-18 MED ORDER — DEXTROSE 50 % IV SOLN: 25.0000 mL | Freq: Once | INTRAVENOUS | Status: DC | PRN

## 2012-07-18 NOTE — Discharge Summary (Signed)
I have reviewed this discharge summary and agree.    

## 2012-07-18 NOTE — Progress Notes (Signed)
NCM spoke to pt and states she is new onset DM. She is wanting education on how to administer insulin. Explained she pick up glucometer, syringes and test strips from her pharmacy. NCM contacted attending MD. Isidoro Donning RN CCM Case Mgmt phone [phone]

## 2012-07-18 NOTE — Progress Notes (Signed)
Spoke with pt via phone re: transportation assistance.  Taxi voucher prepared and tubed to 2900.

## 2012-07-18 NOTE — Progress Notes (Signed)
Patient ID: Kiara Cooper, female   DOB: [birth year], 46 y.o.   MRN: [id number] [facility] Medicine Teaching Service PGY-1 Progress Note   Overnight Events: Patient doing well and feeling much better. Denies nausea, vomit or abdominal pain for over 24 hours.  Objective: Temp:  [97.6 F (36.4 C)-98.6 F (37 C)] 98.4 F (36.9 C) (12/14 0258) Pulse Rate:  [72-81] 72  (12/13 1213) Cardiac Rhythm:  [-] Normal sinus rhythm (12/13 2100) Resp:  [15-16] 16  (12/13 1634) BP: (89-113)/(40-71) 105/57 mmHg (12/14 0258) SpO2:  [94 %-100 %] 95 % (12/14 0258) Weight change:   Physical Exam: Gen: NAD. Sitting up in bed.  HENT: mucous membranes still appear mildly dry.  CV: RRR. No murmur. Lungs: CTAB. No wheezing, rhonchi or rales.  Abd: Soft. NT.ND. No HSM EXT: NT. No erythema. trace edema.   CMP     Component Value Date/Time   NA 133* 07/17/2012 2335   K 3.7 07/17/2012 2335   CL 100 07/17/2012 2335   CO2 22 07/17/2012 2335   GLUCOSE 420* 07/17/2012 2335   BUN 22 07/17/2012 2335   CREATININE 0.99 07/17/2012 2335   CREATININE 1.69* 07/16/2012 0919   CALCIUM 8.5 07/17/2012 2335   PROT 7.6 07/17/2012 0034   ALBUMIN 3.9 07/17/2012 0034   AST 22 07/17/2012 0034   ALT 29 07/17/2012 0034   ALKPHOS 140* 07/17/2012 0034   BILITOT 0.3 07/17/2012 0034   GFRNONAA 67* 07/17/2012 2335   GFRAA 77* 07/17/2012 2335    Assessment and Plan: Kiara Cooper is a 46 y.o. year old female with a history of HTN, COPD, Sleep apnea with a new onset diabetes in hyperglycemic state. Her glucose on admission was >1000 in serum and urine. Her creatine was 1.6 and improved to 1.37. She has a calculated serum osmo of 309. Gluco-stabilzer was initiated admission.  Hyperglycemia:  ---> improving - New onset diabetes mellitus - Replete potassium as needed  - Diabetic education has been completed  Nausea and abdominal discomfort: --> resolved - likely from gastroparesis. KUB obtained earlier today did not show evidence of  SBO or increased stool burden.  - start reglan scheduled and zofran as needed.   Acute Renal Failure: - continue IV fluids --> saline locked - f/u BMP  - hold on potential nephrotoxic agents.   HTN:  - HCTZ, amlodipine, lasix and valsartan are being held given BP in 100's systolic. Hold ARB and thiazide in context of acute renal failure.  Sleep apnea:  - CPAP HS  COPD:  - QVAR  - duo neb PRN  FENGI:  - Glucostabilzer  - NPO- ice chips  - Reglan, zofran  - D5 1/2NS once glucose<250  Proph: SQH  Code: full  Dispo: Stabilization of glucose and diabetes education. Patient lives in a homeless shelter: [facility].

## 2012-07-18 NOTE — Progress Notes (Signed)
Family Medicine Teaching Service Attending Note  I interviewed and examined patient Kiara Cooper and reviewed their tests and x-rays.  I discussed with Dr. Claiborne Billings and reviewed their note for today.  I agree with their assessment and plan.     Additionally  Feeling much improved takiing oral well Ok to discharge on Lantus and sulfonylurea with starting Metformin as outpatient Aim for moderate control with gradual improvement

## 2012-07-19 ENCOUNTER — Other Ambulatory Visit: Payer: Self-pay | Admitting: Family Medicine

## 2012-07-19 LAB — GLUCOSE, CAPILLARY: Glucose-Capillary: 273 mg/dL — ABNORMAL HIGH (ref 70–99)

## 2012-07-19 MED ORDER — GLUCOSE BLOOD VI STRP: ORAL_STRIP | Status: DC

## 2012-07-19 MED ORDER — INSULIN GLARGINE 100 UNIT/ML ~~LOC~~ SOLN: 15.0000 [IU] | Freq: Every day | SUBCUTANEOUS | Status: DC

## 2012-07-19 MED ORDER — ACCU-CHEK ADVANTAGE DIABETES KIT: PACK | Status: DC

## 2012-07-19 MED ORDER — GLIPIZIDE 5 MG PO TABS: 5.0000 mg | ORAL_TABLET | Freq: Two times a day (BID) | ORAL | Status: DC

## 2012-07-19 MED ORDER — FREESTYLE SYSTEM KIT: 1.0000 | PACK | Status: DC | PRN

## 2012-07-19 NOTE — Progress Notes (Signed)
Family Medicine Teaching Service Attending Note  I discussed patient Kiara Cooper  with Dr. Claiborne Billings and reviewed their note for today.  I agree with their assessment and plan.

## 2012-07-19 NOTE — Progress Notes (Signed)
Patient ID: Kiara Cooper, female   DOB: 01-May-1966, 46 y.o.   MRN: [id number] [facility] Medicine Teaching Service PGY-1 Progress Note   Overnight Events: Patient has no complaints except a mild headache. She has been educated on how to give herself insulin.  Objective: Temp:  [97.7 F (36.5 C)-98.6 F (37 C)] 98.4 F (36.9 C) (12/14 2330) Pulse Rate:  [74-77] 77  (12/14 1556) Cardiac Rhythm:  [-] Normal sinus rhythm (12/14 2000) Resp:  [14-18] 18  (12/14 2330) BP: (80-113)/(54-92) 110/87 mmHg (12/15 0300) SpO2:  [96 %-98 %] 98 % (12/14 2330) Weight change:   Physical Exam:  Gen: NAD. Sleeping. Alert upon waking. HENT: MMM.  CV: RRR. No murmur.  Lungs: CTAB. No wheezing, rhonchi or rales.  Abd: Soft. NT.ND. No HSM  EXT: NT. No erythema. trace edema.   CBG (last 3)   Basename 07/18/12 2328 07/18/12 2034 07/18/12 1559  GLUCAP 326* 332* 374*    Assessment and Plan: Kiara Cooper is a 46 y.o. year old female with a history of HTN, COPD, Sleep apnea with a new onset diabetes in hyperglycemic state. Her glucose on admission was >1000 in serum and urine. Her creatine was 1.6 and improved to 0.99. She had a calculated serum osmo of 309. Gluco-stabilzer was initiated admission.  Hyperglycemia: ---> improving  - New onset diabetes mellitus  - Replete potassium as needed  - Diabetic education has been completed, patient has been able to give herself her own shots.  Nausea and abdominal discomfort: --> resolved  - likely from gastroparesis. KUB obtained earlier today did not show evidence of SBO or increased stool burden.  - start reglan scheduled and zofran as needed.  Acute Renal Failure: --> resolved - continue IV fluids --> saline locked  - f/u BMP  - hold on potential nephrotoxic agents.  HTN:  - HCTZ, amlodipine, lasix and valsartan are being held given BP in 100's systolic. Hold ARB and thiazide in context of acute renal failure.  Sleep apnea:  - CPAP HS  COPD:  - QVAR  - duo  neb PRN  FENGI:  - Glucostabilzer  - carb modified diet - Reglan, zofran   Proph: SQH  Code: full  Dispo: Stabilization of glucose and diabetes education. Patient lives in a homeless shelter: [facility]. Probable discharge today.

## 2012-07-19 NOTE — Discharge Summary (Signed)
I have reviewed this discharge summary and agree.    

## 2012-07-20 ENCOUNTER — Encounter: Payer: Self-pay | Admitting: Family Medicine

## 2012-07-20 ENCOUNTER — Ambulatory Visit (INDEPENDENT_AMBULATORY_CARE_PROVIDER_SITE_OTHER): Payer: Medicaid Other | Admitting: Family Medicine

## 2012-07-20 VITALS — BP 138/90 | HR 74 | Temp 97.8°F | Wt 320.0 lb

## 2012-07-20 DIAGNOSIS — E119 Type 2 diabetes mellitus without complications: Secondary | ICD-10-CM

## 2012-07-20 MED ORDER — "INSULIN SYRINGE-NEEDLE U-100 30G X 1/2"" 1 ML MISC": 15.0000 [IU] | Freq: Every day | Status: DC

## 2012-07-21 NOTE — Progress Notes (Signed)
Pt only came to get prescription for needles. She will make appointment for hospital f/u.

## 2012-07-27 ENCOUNTER — Other Ambulatory Visit: Payer: Self-pay | Admitting: *Deleted

## 2012-07-30 ENCOUNTER — Other Ambulatory Visit: Payer: Self-pay | Admitting: Family Medicine

## 2012-07-30 ENCOUNTER — Encounter: Payer: Self-pay | Admitting: *Deleted

## 2012-07-31 ENCOUNTER — Ambulatory Visit (INDEPENDENT_AMBULATORY_CARE_PROVIDER_SITE_OTHER): Payer: Medicaid Other | Admitting: Family Medicine

## 2012-07-31 DIAGNOSIS — E119 Type 2 diabetes mellitus without complications: Secondary | ICD-10-CM

## 2012-07-31 MED ORDER — INSULIN GLARGINE 100 UNIT/ML ~~LOC~~ SOLN: 17.0000 [IU] | Freq: Every day | SUBCUTANEOUS | Status: DC

## 2012-07-31 MED ORDER — AMLODIPINE BESYLATE-VALSARTAN 10-320 MG PO TABS: 1.0000 | ORAL_TABLET | Freq: Every day | ORAL | Status: DC

## 2012-07-31 MED ORDER — METFORMIN HCL 500 MG PO TABS: 1000.0000 mg | ORAL_TABLET | Freq: Two times a day (BID) | ORAL | Status: DC

## 2012-07-31 NOTE — Assessment & Plan Note (Signed)
Lantus increased to 17 Units QHS Metformin increased to 1000 mg BID Glipizide discontinued. Patient encourage to eat well and exercise.

## 2012-07-31 NOTE — Progress Notes (Signed)
Subjective:     Patient ID: Kiara Cooper, female   DOB: 1965-11-14, 46 y.o.   MRN: [id number]  HPI Kiara Cooper presents today for hospital followup.  She was recently hospitalized for hyperglycemia/new onset type 2 diabetes.  1) DM-2 - A1C 15.1 - Currently on Lantus 15 U, Glipized 5 mg BID, and Metformin 500 mg BID - Checking blood sugar up to 4 times a day - Fasting blood glucose - 150-180's. 7 day average 218. - No hypoglycemic events.  - ROS: some diarrhea. Denies nausea, vomiting.  Review of Systems See HPI    Objective:   Physical Exam  Constitutional: She appears well-developed and well-nourished. No distress.      Assessment:        Plan:

## 2012-07-31 NOTE — Patient Instructions (Addendum)
It was nice to see you today.  I have increased your Lantus 17 units at night.  I have discontinued the Glipizide.  You do not have to take it.  I have increased your Metformin to 1000 mg (2 tablets) twice a day with meals.   Please follow up with your primary doctor in 3 months.

## 2012-08-14 NOTE — Telephone Encounter (Signed)
This encounter was created in error - please disregard.

## 2012-09-29 ENCOUNTER — Emergency Department (HOSPITAL_COMMUNITY)
Admission: EM | Admit: 2012-09-29 | Discharge: 2012-09-29 | Disposition: A | Discharge status: Home / Self Care | Payer: Medicaid Other | Attending: Emergency Medicine | Admitting: Emergency Medicine

## 2012-09-29 ENCOUNTER — Emergency Department (HOSPITAL_COMMUNITY): Payer: Medicaid Other

## 2012-09-29 DIAGNOSIS — R059 Cough, unspecified: Secondary | ICD-10-CM | POA: Insufficient documentation

## 2012-09-29 DIAGNOSIS — R0989 Other specified symptoms and signs involving the circulatory and respiratory systems: Secondary | ICD-10-CM | POA: Insufficient documentation

## 2012-09-29 DIAGNOSIS — I10 Essential (primary) hypertension: Secondary | ICD-10-CM | POA: Insufficient documentation

## 2012-09-29 DIAGNOSIS — Z794 Long term (current) use of insulin: Secondary | ICD-10-CM | POA: Insufficient documentation

## 2012-09-29 DIAGNOSIS — F17200 Nicotine dependence, unspecified, uncomplicated: Secondary | ICD-10-CM | POA: Insufficient documentation

## 2012-09-29 DIAGNOSIS — R0789 Other chest pain: Secondary | ICD-10-CM | POA: Insufficient documentation

## 2012-09-29 DIAGNOSIS — Z87891 Personal history of nicotine dependence: Secondary | ICD-10-CM | POA: Insufficient documentation

## 2012-09-29 DIAGNOSIS — Z8739 Personal history of other diseases of the musculoskeletal system and connective tissue: Secondary | ICD-10-CM | POA: Insufficient documentation

## 2012-09-29 DIAGNOSIS — J441 Chronic obstructive pulmonary disease with (acute) exacerbation: Secondary | ICD-10-CM | POA: Insufficient documentation

## 2012-09-29 DIAGNOSIS — I509 Heart failure, unspecified: Secondary | ICD-10-CM | POA: Insufficient documentation

## 2012-09-29 DIAGNOSIS — Z79899 Other long term (current) drug therapy: Secondary | ICD-10-CM | POA: Insufficient documentation

## 2012-09-29 DIAGNOSIS — R0609 Other forms of dyspnea: Secondary | ICD-10-CM | POA: Insufficient documentation

## 2012-09-29 LAB — CBC
Hemoglobin: 11.6 g/dL — ABNORMAL LOW (ref 12.0–15.0)
MCH: 26.9 pg (ref 26.0–34.0)
MCHC: 32.2 g/dL (ref 30.0–36.0)
MCV: 83.5 fL (ref 78.0–100.0)
RBC: 4.31 MIL/uL (ref 3.87–5.11)

## 2012-09-29 LAB — COMPREHENSIVE METABOLIC PANEL
BUN: 15 mg/dL (ref 6–23)
CO2: 28 mEq/L (ref 19–32)
Calcium: 9.2 mg/dL (ref 8.4–10.5)
Creatinine, Ser: 0.97 mg/dL (ref 0.50–1.10)
GFR calc Af Amer: 79 mL/min — ABNORMAL LOW (ref >90)
GFR calc non Af Amer: 68 mL/min — ABNORMAL LOW (ref >90)
Glucose, Bld: 113 mg/dL — ABNORMAL HIGH (ref 70–99)
Sodium: 138 mEq/L (ref 135–145)
Total Protein: 7.4 g/dL (ref 6.0–8.3)

## 2012-09-29 LAB — PRO B NATRIURETIC PEPTIDE: Pro B Natriuretic peptide (BNP): 50.2 pg/mL (ref 0–125)

## 2012-09-29 LAB — TROPONIN I: Troponin I: <0.3 ng/mL (ref <0.30)

## 2012-09-29 MED ORDER — ALBUTEROL (5 MG/ML) CONTINUOUS INHALATION SOLN
10.0000 mg/h | INHALATION_SOLUTION | RESPIRATORY_TRACT | Status: AC
  Administered 2012-09-29: 10 mg/h via RESPIRATORY_TRACT
  Filled 2012-09-29: qty 80

## 2012-09-29 MED ORDER — IPRATROPIUM BROMIDE 0.02 % IN SOLN
0.5000 mg | RESPIRATORY_TRACT | Status: AC
  Administered 2012-09-29: 0.5 mg via RESPIRATORY_TRACT
  Filled 2012-09-29: qty 2.5

## 2012-09-29 MED ORDER — ALBUTEROL SULFATE (5 MG/ML) 0.5% IN NEBU
2.5000 mg | INHALATION_SOLUTION | RESPIRATORY_TRACT | Status: AC
  Administered 2012-09-29: 2.5 mg via RESPIRATORY_TRACT
  Filled 2012-09-29: qty 1

## 2012-09-29 MED ORDER — PREDNISONE 20 MG PO TABS
60.0000 mg | ORAL_TABLET | ORAL | Status: AC
  Administered 2012-09-29: 60 mg via ORAL
  Filled 2012-09-29: qty 3

## 2012-09-29 MED ORDER — PREDNISONE 20 MG PO TABS: 60.0000 mg | ORAL_TABLET | Freq: Every day | ORAL | Status: AC

## 2012-09-29 MED ORDER — ALBUTEROL SULFATE HFA 108 (90 BASE) MCG/ACT IN AERS: 1.0000 | INHALATION_SPRAY | Freq: Four times a day (QID) | RESPIRATORY_TRACT | Status: DC | PRN

## 2012-09-29 MED ORDER — ALBUTEROL SULFATE HFA 108 (90 BASE) MCG/ACT IN AERS
2.0000 | INHALATION_SPRAY | RESPIRATORY_TRACT | Status: DC
  Administered 2012-09-29: 2 via RESPIRATORY_TRACT
  Filled 2012-09-29: qty 6.7

## 2012-09-29 NOTE — ED Provider Notes (Signed)
History     CSN: [id number]  Arrival date & time 09/29/12  1521   First MD Initiated Contact with Patient 09/29/12 1535      Chief Complaint  Patient presents with  . Shortness of Breath    (Consider location/radiation/quality/duration/timing/severity/associated sxs/prior treatment) HPI The patient presents with 2 days of wheezing, chest tightness.  No clear precipitant.  Since onset the patient has had persistent wheezing, dyspnea, with minimal cough.  There is no other"chest pain", nor any fever, chills, nausea, vomiting, lightheadedness, syncope, unilateral edema. The patient states that she had a similar episode recently, that was attributed to seasonal changes. No relief this episode with using her home inhaler. En route the patient received one nebulizer treatment, with some improvement in her condition.  Past Medical History  Diagnosis Date  . Hypertension   . Arthritis   . Edema   . 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 No orthopnea. Epworth Sleepiness Scale is 10.  Plan: Polysomnography studies.    Marland Kitchen COPD (chronic obstructive pulmonary disease)   . CHF (congestive heart failure)     Past Surgical History  Procedure Laterality Date  . Hernia repair      Umbilical  . Cesarean section w/btl    . Tubal ligation      Family History  Problem Relation Age of Onset  . Diabetes Father   . Stroke Mother   . Coronary artery disease Maternal Aunt 77  . Coronary artery disease Maternal Grandfather 89    History  Substance Use Topics  . Smoking status: Former Smoker -- 2.00 packs/day for 30 years    Types: Cigarettes    Quit date: 09/12/2011  . Smokeless tobacco: Never Used  . Alcohol Use: No     Comment: off alcohol since 08/22/11 -every day about 24 pack a day    OB History   Grav Para Term Preterm Abortions TAB SAB Ect Mult Living                  Review of Systems   Constitutional:       Per HPI, otherwise negative  HENT:       Per HPI, otherwise negative  Respiratory:       Per HPI, otherwise negative  Cardiovascular:       Per HPI, otherwise negative  Gastrointestinal: Negative for vomiting.  Endocrine:       Negative aside from HPI  Genitourinary:       Neg aside from HPI   Musculoskeletal:       Per HPI, otherwise negative  Skin: Negative.   Neurological: Negative for syncope.    Allergies  Review of patient's allergies indicates no known allergies.  Home Medications   Current Outpatient Rx  Name  Route  Sig  Dispense  Refill  . albuterol (PROVENTIL) (2.5 MG/3ML) 0.083% nebulizer solution   Nebulization   Take 2.5 mg by nebulization every 6 (six) hours as needed for wheezing.         Marland Kitchen amLODipine-valsartan (EXFORGE) 10-320 MG per tablet   Oral   Take 1 tablet by mouth daily.   30 tablet   6   . furosemide (LASIX) 20 MG tablet   Oral   Take 1 tablet (20 mg total) by mouth daily.   30 tablet   3   . insulin glargine (LANTUS) 100 UNIT/ML injection   Subcutaneous  Inject 17 Units into the skin at bedtime.   10 mL   3   . meloxicam (MOBIC) 15 MG tablet   Oral   Take 1 tablet (15 mg total) by mouth daily.   30 tablet   3   . metFORMIN (GLUCOPHAGE) 500 MG tablet   Oral   Take 2 tablets (1,000 mg total) by mouth 2 (two) times daily with a meal.   120 tablet   6   . bd getting started take home kit MISC   Other   1 kit by Other route once.   1 kit   0   . Blood Glucose Monitoring Suppl (ACCU-CHEK ADVANTAGE DIABETES) kit      Use to check fasting glucose each morning.  Diagnosis DM Type II 250.00   1 each   0   . glucose blood (ACCU-CHEK ADVANTAGE TEST) test strip      Use to check fasting glucose each morning.  Diagnosis DM Type II 250.00   100 each   12   . Insulin Syringe-Needle U-100 30G X 1/2" 1 ML MISC   Does not apply   15 Units by Does not apply route at bedtime.   100 each   12     BP  122/58  Pulse 88  Temp(Src) 98 F (36.7 C) (Oral)  Resp 18  SpO2 100%  Physical Exam  Nursing note and vitals reviewed. Constitutional: She is oriented to person, place, and time. She appears well-developed and well-nourished. No distress.  HENT:  Head: Normocephalic and atraumatic.  Eyes: Conjunctivae and EOM are normal.  Cardiovascular: Normal rate and regular rhythm.   Pulmonary/Chest: Effort normal. No stridor. No respiratory distress. She has decreased breath sounds. She has wheezes.  Abdominal: She exhibits no distension.  Musculoskeletal: She exhibits no edema.  Neurological: She is alert and oriented to person, place, and time. No cranial nerve deficit.  Skin: Skin is warm and dry.  Psychiatric: She has a normal mood and affect.    ED Course  Procedures (including critical care time)  Labs Reviewed  CBC  COMPREHENSIVE METABOLIC PANEL  TROPONIN I  PRO B NATRIURETIC PEPTIDE   No results found.   No diagnosis found.  Pulse ox 96% nasal cannula abnormal Cardiac 80 sinus rhythm normal    Date: 09/29/2012  Rate: 93  Rhythm: normal sinus rhythm  QRS Axis: normal  Intervals: normal  ST/T Wave abnormalities: t- wave changes - non-specific.  Conduction Disutrbances:none  Narrative Interpretation:   Old EKG Reviewed: more flat T-waves abnormal  4:58 PM Patient improving.  Wheezing still present.  6:43 PM Patient appears substantially better.  She states that she is ready for discharge. MDM  This pleasant female presents with ongoing wheezing, dyspnea.  Following multiple nebulizer treatments, initiation of steroids, the patient was substantially improved.  We discussed return precautions, the need for primary care and I wish and further management of her COPD exacerbation.  Absent distress, significant lab abnormalities, evidence of pneumonia, and with her improvement she is appropriate for discharge with outpatient followup      Gerhard Munch,  MD 09/29/12 1843

## 2012-09-29 NOTE — ED Notes (Signed)
Pt has a hx of COPD with wheezing today. Pt had 5mg  albuterol en route. 100% on O2 now. Used her own neb today. SOB started this morning.

## 2012-10-02 MED FILL — Albuterol Sulfate Soln Nebu 0.5% (5 MG/ML): RESPIRATORY_TRACT | Qty: 20 | Status: AC

## 2012-10-05 ENCOUNTER — Encounter: Payer: Self-pay | Admitting: Family Medicine

## 2012-10-05 ENCOUNTER — Ambulatory Visit (INDEPENDENT_AMBULATORY_CARE_PROVIDER_SITE_OTHER): Payer: Medicaid Other | Admitting: Family Medicine

## 2012-10-05 VITALS — BP 136/87 | HR 72 | Temp 97.9°F | Ht 65.0 in | Wt 321.6 lb

## 2012-10-05 DIAGNOSIS — R0602 Shortness of breath: Secondary | ICD-10-CM

## 2012-10-05 DIAGNOSIS — J449 Chronic obstructive pulmonary disease, unspecified: Secondary | ICD-10-CM

## 2012-10-05 DIAGNOSIS — E119 Type 2 diabetes mellitus without complications: Secondary | ICD-10-CM

## 2012-10-05 LAB — POCT GLYCOSYLATED HEMOGLOBIN (HGB A1C): Hemoglobin A1C: 7

## 2012-10-05 MED ORDER — GLUCOSE BLOOD VI STRP: ORAL_STRIP | Status: DC

## 2012-10-05 MED ORDER — TIOTROPIUM BROMIDE MONOHYDRATE 18 MCG IN CAPS: 18.0000 ug | ORAL_CAPSULE | Freq: Every day | RESPIRATORY_TRACT | Status: DC

## 2012-10-05 MED ORDER — ALBUTEROL SULFATE (2.5 MG/3ML) 0.083% IN NEBU
2.5000 mg | INHALATION_SOLUTION | Freq: Once | RESPIRATORY_TRACT | Status: AC
  Administered 2012-10-05 (×2): 2.5 mg via RESPIRATORY_TRACT

## 2012-10-05 MED ORDER — IPRATROPIUM BROMIDE 0.02 % IN SOLN
0.5000 mg | Freq: Once | RESPIRATORY_TRACT | Status: AC
  Administered 2012-10-05: 0.5 mg via RESPIRATORY_TRACT

## 2012-10-05 NOTE — Progress Notes (Signed)
Family Medicine Office Visit Note   Subjective:   Patient ID: Kiara Cooper, female  DOB: [birth year], 47 y.o.. MRN: [id number]   Pt that comes today for DM follow up. She reports is taking 17 units of Lantus daily and denies side effects of hypoglycemia or symptoms of hyperglycemia. She understands her condition and is trying to make better choices regarding diet. Exercise is still a challenge for her due to her COPD and osteoarthritis.   COPD: Today with mild SOB and wheezing. Reports using Albuterol only and does not remember why Spririva was stopped. She has had evaluation by Cardiology who recommends Pulmonology referral. Pt was recently at [facility] (2/25) with COPD exacerbation.   Review of Systems:  Pt denies SOB, chest pain, palpitations, headaches, dizziness, numbness or weakness. No changes on urinary or BM habits. No unintentional weigh loss/gain.  Objective:   Physical Exam: Gen:  NAD HEENT: Moist mucous membranes  CV: Regular rate and rhythm, no murmurs rubs or gallops PULM: Clear to auscultation bilaterally. Wheezes present on both lung fields that responded to Duoneb treated in the office. ABD: Obese. Soft, non tender, non distended, normal bowel sounds. EXT: No edema Neuro: Alert and oriented x3. No focalization  Assessment & Plan:

## 2012-10-05 NOTE — Patient Instructions (Addendum)
It has been a pleasure to see you today. Your Diabetes is very well controlled. Continue taking medication as prescribed. Restart Spiriva 1 application daily and continue Albuterol PRN. You need to f/u with Cardiology  and I have placed a referral for Pulmonology Make a f/u appointment with me in 3 months or sooner if needed.

## 2012-10-05 NOTE — Assessment & Plan Note (Signed)
Pt needs to continue f/u with Cardiology.

## 2012-10-05 NOTE — Assessment & Plan Note (Addendum)
Mild Wheezing today that completely resolved with Duoneb in the Office, normal O2 sats.  Multiple exacerbations since diagnosis, not well controlled. Pt morbidly obese with also Severe Sleep Apnea and recently diagnosed DM. Plan: Restart Spiriva and continue Albuterol PRN Pulmonology referral.

## 2012-10-05 NOTE — Assessment & Plan Note (Addendum)
Controlled. A1C 7.0 today. Plan: Continue current regimen of Insulin and Metforming F/u  in 3 months.

## 2012-10-06 ENCOUNTER — Other Ambulatory Visit: Payer: Self-pay | Admitting: *Deleted

## 2012-10-06 ENCOUNTER — Telehealth: Payer: Self-pay | Admitting: *Deleted

## 2012-10-06 DIAGNOSIS — J449 Chronic obstructive pulmonary disease, unspecified: Secondary | ICD-10-CM

## 2012-10-06 MED ORDER — GLUCOSE BLOOD VI STRP: ORAL_STRIP | Status: DC

## 2012-10-06 NOTE — Telephone Encounter (Signed)
Spoke with Ms. Nold is regards to her Pulmonology referral.  While on the phone Ms. Vancamp ask if Dr. Aviva Signs could send in her Diabetic Test Strips to [facility] on [address].  Originally sent to [facility] but they don't provide test strips with Medicaid.  Prescription for test strips sent to [facility].  Ileana Ladd

## 2012-10-08 ENCOUNTER — Other Ambulatory Visit: Payer: Self-pay | Admitting: Family Medicine

## 2012-10-08 MED ORDER — FUROSEMIDE 20 MG PO TABS: 20.0000 mg | ORAL_TABLET | Freq: Every day | ORAL | Status: DC

## 2012-10-12 ENCOUNTER — Encounter (HOSPITAL_COMMUNITY): Payer: Self-pay | Admitting: Emergency Medicine

## 2012-10-12 ENCOUNTER — Emergency Department (HOSPITAL_COMMUNITY)
Admission: EM | Admit: 2012-10-12 | Discharge: 2012-10-12 | Disposition: A | Discharge status: Home / Self Care | Payer: Medicaid Other | Attending: Emergency Medicine | Admitting: Emergency Medicine

## 2012-10-12 ENCOUNTER — Emergency Department (HOSPITAL_COMMUNITY): Payer: Medicaid Other

## 2012-10-12 DIAGNOSIS — Z79899 Other long term (current) drug therapy: Secondary | ICD-10-CM | POA: Insufficient documentation

## 2012-10-12 DIAGNOSIS — Z87891 Personal history of nicotine dependence: Secondary | ICD-10-CM | POA: Insufficient documentation

## 2012-10-12 DIAGNOSIS — I509 Heart failure, unspecified: Secondary | ICD-10-CM | POA: Insufficient documentation

## 2012-10-12 DIAGNOSIS — I10 Essential (primary) hypertension: Secondary | ICD-10-CM | POA: Insufficient documentation

## 2012-10-12 DIAGNOSIS — M129 Arthropathy, unspecified: Secondary | ICD-10-CM | POA: Insufficient documentation

## 2012-10-12 DIAGNOSIS — J441 Chronic obstructive pulmonary disease with (acute) exacerbation: Secondary | ICD-10-CM

## 2012-10-12 DIAGNOSIS — Z8669 Personal history of other diseases of the nervous system and sense organs: Secondary | ICD-10-CM | POA: Insufficient documentation

## 2012-10-12 LAB — CBC WITH DIFFERENTIAL/PLATELET
Basophils Absolute: 0 10*3/uL (ref 0.0–0.1)
Basophils Relative: 0 % (ref 0–1)
HCT: 34.7 % — ABNORMAL LOW (ref 36.0–46.0)
Lymphocytes Relative: 18 % (ref 12–46)
MCHC: 32 g/dL (ref 30.0–36.0)
Monocytes Absolute: 0.6 10*3/uL (ref 0.1–1.0)
Neutro Abs: 4.7 10*3/uL (ref 1.7–7.7)
Platelets: 302 10*3/uL (ref 150–400)
RDW: 14.3 % (ref 11.5–15.5)
WBC: 7.2 10*3/uL (ref 4.0–10.5)

## 2012-10-12 LAB — BASIC METABOLIC PANEL
BUN: 10 mg/dL (ref 6–23)
Chloride: 103 mEq/L (ref 96–112)
Creatinine, Ser: 0.8 mg/dL (ref 0.50–1.10)
GFR calc Af Amer: >90 mL/min (ref >90)
GFR calc non Af Amer: 86 mL/min — ABNORMAL LOW (ref >90)
Potassium: 3.4 mEq/L — ABNORMAL LOW (ref 3.5–5.1)

## 2012-10-12 LAB — TROPONIN I: Troponin I: <0.3 ng/mL (ref <0.30)

## 2012-10-12 LAB — PRO B NATRIURETIC PEPTIDE: Pro B Natriuretic peptide (BNP): 24.7 pg/mL (ref 0–125)

## 2012-10-12 MED ORDER — HYDROCOD POLST-CHLORPHEN POLST 10-8 MG/5ML PO LQCR
5.0000 mL | Freq: Once | ORAL | Status: AC
  Administered 2012-10-12: 5 mL via ORAL
  Filled 2012-10-12: qty 5

## 2012-10-12 MED ORDER — ALBUTEROL SULFATE (5 MG/ML) 0.5% IN NEBU
5.0000 mg | INHALATION_SOLUTION | RESPIRATORY_TRACT | Status: DC
  Administered 2012-10-12 (×2): 5 mg via RESPIRATORY_TRACT
  Filled 2012-10-12: qty 1
  Filled 2012-10-12: qty 0.5

## 2012-10-12 MED ORDER — IPRATROPIUM BROMIDE 0.02 % IN SOLN
0.5000 mg | RESPIRATORY_TRACT | Status: DC
  Administered 2012-10-12 (×2): 0.5 mg via RESPIRATORY_TRACT
  Filled 2012-10-12 (×2): qty 2.5

## 2012-10-12 MED ORDER — PREDNISONE 20 MG PO TABS
60.0000 mg | ORAL_TABLET | Freq: Once | ORAL | Status: AC
  Administered 2012-10-12: 60 mg via ORAL
  Filled 2012-10-12: qty 3

## 2012-10-12 MED ORDER — ALBUTEROL SULFATE (5 MG/ML) 0.5% IN NEBU: 2.5000 mg | INHALATION_SOLUTION | RESPIRATORY_TRACT | Status: DC | PRN

## 2012-10-12 MED ORDER — FUROSEMIDE 10 MG/ML IJ SOLN
40.0000 mg | Freq: Once | INTRAMUSCULAR | Status: AC
Start: 2012-10-12 — End: 2012-10-12
  Administered 2012-10-12: 40 mg via INTRAVENOUS
  Filled 2012-10-12: qty 4

## 2012-10-12 MED ORDER — ALBUTEROL SULFATE (5 MG/ML) 0.5% IN NEBU
10.0000 mg | INHALATION_SOLUTION | Freq: Once | RESPIRATORY_TRACT | Status: AC
  Administered 2012-10-12: 10 mg via RESPIRATORY_TRACT
  Filled 2012-10-12: qty 2

## 2012-10-12 MED ORDER — PREDNISONE 20 MG PO TABS: 40.0000 mg | ORAL_TABLET | Freq: Every day | ORAL | Status: DC

## 2012-10-12 MED ORDER — MOXIFLOXACIN HCL 400 MG PO TABS: 400.0000 mg | ORAL_TABLET | Freq: Every day | ORAL | Status: DC

## 2012-10-12 NOTE — ED Notes (Signed)
Pt discharged home per pt choice; given and explained all discharge instructions/prescriptions; stated understanding/denied questions/concerns

## 2012-10-12 NOTE — ED Provider Notes (Signed)
  Physical Exam  BP 129/69  Pulse 99  Temp(Src) 98.2 F (36.8 C) (Oral)  Resp 18  SpO2 99%  Physical Exam  ED Course  Procedures  MDM I have personally evaluated the pt twice, her wheezing has improved, she has ambulated 2 laps around the ED with a resultant sat of 97%.  She has normal labs and her xray does not show overt edema or infiltrate - i have had a long discussion with her about admission and observation vs going home and have recommended admission but she has chosen to go home with 24 hour follow up - I have also d/w case with Dr. Antony Haste of FP service who will have office arrange a follow up phone call and office visit to the pt.  Pt given Rx for   Avelox Prednisone Albuterol      Vida Roller, MD 10/12/12 1020

## 2012-10-12 NOTE — ED Notes (Signed)
Report given via EMS. Pt c/o SOB onset 0100 progressively worse until 0300. Pt hx of COPD. Pt in tripod position, nasal flaring on scene. Diminished lung sounds in all fields. Mild wheezes in upper respiratory. Pt given atrovent 0.5 and albuterol 5mg   en route. 100% on RA. VS 120/70 HR 94 RR 30 initially 22 now at 0311. 20 gauge in left hand.

## 2012-10-12 NOTE — ED Provider Notes (Signed)
History     CSN: [id number]  Arrival date & time 10/12/12  0321   First MD Initiated Contact with Patient 10/12/12 [phone]      Chief Complaint  Patient presents with  . Shortness of Breath    (Consider location/radiation/quality/duration/timing/severity/associated sxs/prior treatment) HPI Kiara Cooper is a 47 y.o. female with a history of COPD, congestive heart failure, peripheral edema and hypertension presents with difficulty breathing. Patient is had what she calls "a cold" and has had a cough that's been minimally productive. She denies any fevers or chills, denies any chest pain this is her chest is been tight. She's used her home nebulizer and albuterol inhaler without success. Her shortness of breath has been progressively worsens the last seen in emergency department, and is currently severe.  Denies any headache, myalgias, rash, new arthralgias.  Or shortness of breath is worsened by exertion.  Patient is a former 60-pack-year history smoking  Past Medical History  Diagnosis Date  . Hypertension   . Arthritis   . Edema   . 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 No orthopnea. Epworth Sleepiness Scale is 10.  Plan: Polysomnography studies.    Marland Kitchen COPD (chronic obstructive pulmonary disease)   . CHF (congestive heart failure)     Past Surgical History  Procedure Laterality Date  . Hernia repair      Umbilical  . Cesarean section w/btl    . Tubal ligation      Family History  Problem Relation Age of Onset  . Diabetes Father   . Stroke Mother   . Coronary artery disease Maternal Aunt 77  . Coronary artery disease Maternal Grandfather 89    History  Substance Use Topics  . Smoking status: Former Smoker -- 2.00 packs/day for 30 years    Types: Cigarettes    Quit date: 09/12/2011  . Smokeless tobacco: Never Used  . Alcohol Use: No     Comment: off alcohol since 08/22/11 -every day about 24  pack a day    OB History   Grav Para Term Preterm Abortions TAB SAB Ect Mult Living                  Review of Systems At least 10pt or greater review of systems completed and are negative except where specified in the HPI.  Allergies  Review of patient's allergies indicates no known allergies.  Home Medications   Current Outpatient Rx  Name  Route  Sig  Dispense  Refill  . albuterol (PROVENTIL HFA;VENTOLIN HFA) 108 (90 BASE) MCG/ACT inhaler   Inhalation   Inhale 1-2 puffs into the lungs every 6 (six) hours as needed for wheezing.   1 Inhaler   0   . albuterol (PROVENTIL) (2.5 MG/3ML) 0.083% nebulizer solution   Nebulization   Take 2.5 mg by nebulization every 6 (six) hours as needed for wheezing.         Marland Kitchen amLODipine-valsartan (EXFORGE) 10-320 MG per tablet   Oral   Take 1 tablet by mouth daily.   30 tablet   6   . furosemide (LASIX) 20 MG tablet   Oral   Take 1 tablet (20 mg total) by mouth daily.   30 tablet   3   . insulin glargine (LANTUS) 100 UNIT/ML injection   Subcutaneous   Inject 17 Units into the skin at bedtime.   10 mL   3   .  meloxicam (MOBIC) 15 MG tablet   Oral   Take 1 tablet (15 mg total) by mouth daily.   30 tablet   3   . metFORMIN (GLUCOPHAGE) 500 MG tablet   Oral   Take 2 tablets (1,000 mg total) by mouth 2 (two) times daily with a meal.   120 tablet   6   . tiotropium (SPIRIVA) 18 MCG inhalation capsule   Inhalation   Place 1 capsule (18 mcg total) into inhaler and inhale daily.   30 capsule   12     BP 108/74  Pulse 79  Temp(Src) 98.4 F (36.9 C) (Oral)  Resp 14  SpO2 100%  Physical Exam  Nursing notes reviewed.  Electronic medical record reviewed. VITAL SIGNS:   Filed Vitals:   10/12/12 0600 10/12/12 0740 10/12/12 0747 10/12/12 0858  BP: 108/74     Pulse: 79     Temp:      TempSrc:      Resp: 14     SpO2: 100% 97% 97% 100%   CONSTITUTIONAL: Awake, oriented, appears non-toxic HENT: Atraumatic,  normocephalic, oral mucosa pink and moist, airway patent. Nares patent without drainage. External ears normal. EYES: Conjunctiva clear, EOMI, PERRLA NECK: Trachea midline, non-tender, supple CARDIOVASCULAR: Normal heart rate, Normal rhythm, No murmurs, rubs, gallops PULMONARY/CHEST: Decreased breath sounds bilaterally, wheezing inspiratory and expiratory, some scant rales in the bases. Symmetrical breath sounds. Non-tender. ABDOMINAL: Non-distended, morbidly obese, soft, non-tender - no rebound or guarding.  BS normal. NEUROLOGIC: Non-focal, moving all four extremities, no gross sensory or motor deficits. EXTREMITIES: No clubbing, cyanosis, 2+ lower extremity pitting edema SKIN: Warm, Dry, No erythema, No rash  ED Course  Procedures (including critical care time)  Date: 10/12/2012  Rate: 91  Rhythm: normal sinus rhythm  QRS Axis: normal  Intervals: normal  ST/T Wave abnormalities: T-wave abnormalities in inferior leads seen on prior EKG dated 09/29/2012  Conduction Disutrbances: none  Narrative Interpretation: unremarkable - no significant change from prior EKG for every 20 12/22/2012     Labs Reviewed  CBC WITH DIFFERENTIAL - Abnormal; Notable for the following:    Hemoglobin 11.1 (*)    HCT 34.7 (*)    Eosinophils Relative 8 (*)    All other components within normal limits  BASIC METABOLIC PANEL - Abnormal; Notable for the following:    Potassium 3.4 (*)    Glucose, Bld 129 (*)    GFR calc non Af Amer 86 (*)    All other components within normal limits  TROPONIN I  PRO B NATRIURETIC PEPTIDE   No results found. Chest x-ray as interpreted by radiology and confirmed by myself shows likely enlarged cardiac silhouette, does not appear different than prior chest x-ray, there are no effusions seen, no pneumothorax. No acute fractures. Hypoaeration with interstitial vascular crowding, no confluent airspace opacity.  No diagnosis found. Shortness of breath, COPD  exacerbation   MDM  Kiara Cooper is a 46 y.o. female who is morbidly obese the history of COPD and 60-pack-year smoking history as well as congestive heart failure peripheral edema presents with dyspnea. Patient presented to the ER 2 weeks ago was treated for similar symptoms. Do not think this patient has influenza, though it sounds that she does have an upper respiratory infection that has worsened her COPD. We'll treat the patient with nebulized bronchodilators as well as steroids, obtain chest x-ray and basic labs.  EKG is unchanged from a couple of weeks ago, troponin and BNP are unremarkable. Patient  has mild hypo-daily at 3.4, I do not think this requires treatment at this time and is likely secondary to albuterol administration.  Chest x-ray shows some vascular crowding, some mild interstitial edema, have given the patient some Lasix. No evidence for acute coronary syndrome, do not suspect myocarditis, pericarditis, aortic dissection, there is no evidence for pneumothorax or infiltrate on chest x-ray. Diagnosis at this time is likely a COPD exacerbation. Patient has received duo nebs x2, Lasix as well as prednisone. Reevaluated the patient, she still has inspiratory next for wheezing with prolonged expiratory phase some mild tachypnea. We'll try another hour-long albuterol treatment. Patient may require admission for COPD exacerbation.  Please see Dr. Rondel Baton note for final disposition.         Jones Skene, MD 10/12/12 [phone]

## 2012-10-12 NOTE — ED Notes (Signed)
Pt walked two laps around nurses station became very short of breath; wheezing to upper lopes; 96 on RA after ambulation

## 2012-10-22 ENCOUNTER — Encounter: Payer: Self-pay | Admitting: Family Medicine

## 2012-10-22 ENCOUNTER — Ambulatory Visit (INDEPENDENT_AMBULATORY_CARE_PROVIDER_SITE_OTHER): Payer: Medicaid Other | Admitting: Family Medicine

## 2012-10-22 VITALS — BP 130/78 | HR 85 | Temp 99.4°F | Ht 65.0 in | Wt 320.0 lb

## 2012-10-22 DIAGNOSIS — J449 Chronic obstructive pulmonary disease, unspecified: Secondary | ICD-10-CM

## 2012-10-22 DIAGNOSIS — R0602 Shortness of breath: Secondary | ICD-10-CM

## 2012-10-22 DIAGNOSIS — M1710 Unilateral primary osteoarthritis, unspecified knee: Secondary | ICD-10-CM

## 2012-10-22 MED ORDER — MELOXICAM 15 MG PO TABS: 15.0000 mg | ORAL_TABLET | Freq: Every day | ORAL | Status: DC

## 2012-10-22 MED ORDER — IPRATROPIUM BROMIDE 0.02 % IN SOLN
0.5000 mg | Freq: Once | RESPIRATORY_TRACT | Status: AC
  Administered 2012-10-22: 0.5 mg via RESPIRATORY_TRACT

## 2012-10-22 NOTE — Patient Instructions (Addendum)
Please do not miss appointment tomorrow with Pulmonology.  No changes on your current  medication regimen. F/u in a month or sooner if needed.

## 2012-10-23 ENCOUNTER — Ambulatory Visit (INDEPENDENT_AMBULATORY_CARE_PROVIDER_SITE_OTHER): Payer: Medicaid Other | Admitting: Pulmonary Disease

## 2012-10-23 ENCOUNTER — Encounter: Payer: Self-pay | Admitting: Pulmonary Disease

## 2012-10-23 VITALS — BP 144/98 | HR 94 | Temp 98.1°F | Ht 65.0 in | Wt 324.0 lb

## 2012-10-23 DIAGNOSIS — R0602 Shortness of breath: Secondary | ICD-10-CM

## 2012-10-23 MED ORDER — OMEPRAZOLE 40 MG PO CPDR: 40.0000 mg | DELAYED_RELEASE_CAPSULE | Freq: Two times a day (BID) | ORAL | Status: DC

## 2012-10-23 NOTE — Patient Instructions (Addendum)
Stay on spiriva for now, but I am not convinced you have copd.   Can use albuterol only for emergencies, and use only the lower strength. Will treat you for reflux with omeprazole 40mg  in am and pm everyday!! Will schedule you for breathing studies, and see you back to review. If you have breathing issues, stop what you are doing, and do the breathing exercises we discussed.

## 2012-10-23 NOTE — Progress Notes (Signed)
  Subjective:    Patient ID: Kiara Cooper, female    DOB: April 29, 1966, 47 y.o.   MRN: [id number]  HPI The patient is a 47 year old female who I am asked to see for dyspnea on exertion.  She's been given the diagnosis of COPD, but tells me that she has never had pulmonary function studies.  She has had breathing issues over the last one year, and feels they are getting worse.  She describes episodes of where she develops throat and upper chest tightness with exertional activities, but can also occur at rest.  She feels this "shuts down her breathing".  She has no benefit from her inhalers when this occurs.  The patient presents today with classic upper airway pseudowheezing, and will improve with a strong cough.  Other history of importance, she is morbidly obese, and also has a long history of crack cocaine use.  She has had a cardiac workup that was unremarkable, although my suspicion is that she has diastolic dysfunction.  She has had a recent chest x-ray that shows prominent vascular markings.  The patient has a cough at times during the day, and is usually productive of nonpurulent mucus.  She describes a less than one block dyspnea on exertion at a moderate pace on flat ground, and will get winded bringing groceries in from the car or sweeping.  She does have postnasal drip with some intermittent sinus congestion.  She also describes worsening reflux symptoms, and over the last week or so has had regurgitation of food.   Review of Systems  Constitutional: Negative for fever and unexpected weight change.  HENT: Negative for ear pain, nosebleeds, congestion, sore throat, rhinorrhea, sneezing, trouble swallowing, dental problem, postnasal drip and sinus pressure.   Eyes: Negative for redness and itching.  Respiratory: Positive for cough, chest tightness and shortness of breath. Negative for wheezing.   Cardiovascular: Positive for leg swelling. Negative for palpitations.  Gastrointestinal: Negative for  nausea and vomiting.  Genitourinary: Negative for dysuria.  Musculoskeletal: Positive for joint swelling.  Skin: Negative for rash.  Neurological: Positive for headaches.  Hematological: Does not bruise/bleed easily.  Psychiatric/Behavioral: Negative for dysphoric mood. The patient is not nervous/anxious.        Objective:   Physical Exam Constitutional:  Morbidly obese female, no acute distress  HENT:  Nares patent without discharge  Oropharynx without exudate, palate and uvula are thick and elongated.   Eyes:  Perrla, eomi, no scleral icterus  Neck:  No JVD, no TMG  Cardiovascular:  Normal rate, regular rhythm, no rubs or gallops.  No murmurs        Intact distal pulses  Pulmonary :  Normal breath sounds, no stridor or respiratory distress   No rales, rhonchi, or wheezing.  Poor depth of inspiration secondary to centripetal obesity.   Abdominal:  Soft, nondistended, bowel sounds present.  No tenderness noted.   Musculoskeletal:  1+ lower extremity edema noted.  Lymph Nodes:  No cervical lymphadenopathy noted  Skin:  No cyanosis noted  Neurologic:  Alert, appropriate, moves all 4 extremities without obvious deficit.         Assessment & Plan:

## 2012-10-23 NOTE — Assessment & Plan Note (Signed)
The patient more than likely has multifactorial dyspnea, primarily related to her morbid obesity and deconditioning.  Within and cut that shows an elevated ejection fraction, and prominent vascular markings on her most recent chest x-ray, I suspect she also has diastolic dysfunction.  The big question is whether she has underlying lung disease, but she tells me she has never had pulmonary function studies.  Her history and exam is most consistent with an upper airway issue, with classic pseudo-wheezing that resolved with purse lipped breathing.  This could either be vocal cord dysfunction, reflux-induced upper airway instability, or possibly some kind of damage related to her crack cocaine use.  I would like to do pulmonary function studies, and at some point she may need an upper airway evaluation as well by otolaryngology.  In the meantime, I would like to treat her aggressively for reflux disease.

## 2012-10-24 ENCOUNTER — Encounter: Payer: Self-pay | Admitting: Family Medicine

## 2012-10-24 NOTE — Assessment & Plan Note (Signed)
Wheezing that improved after ipratropium nebulizer and a dose of Albuterol inhaler in the room. O2 sats are normal.  Plan Pulmonology appointment next Friday. Continue with current treatment.

## 2012-10-24 NOTE — Progress Notes (Signed)
Family Medicine Office Visit Note   Subjective:   Patient ID: Kiara Cooper, female  DOB: 1966/04/22, 47 y.o.. MRN: [id number]   Pt that comes today to f/u recent ED visit due to worsening SOB. She reports feeling better and reports compliance with her meds. She has a f/u appointment scheduled on the 3/21 with Pulmonology.  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 She reports that she has contracted a lawyer to work on her case for disability.  Review of Systems:  Pt denies chest pain, palpitations, headaches, dizziness, numbness or weakness. No changes on urinary or BM habits.   Objective:   Physical Exam: Gen:  NAD HEENT: Moist mucous membranes  CV: Regular rate and rhythm, no murmurs rubs or gallops PULM: Diminished to auscultation bilaterally. Presence of wheezes, no rales or rhonchi. ABD: Soft, non tender, non distended, normal bowel sounds EXT: No edema. Neuro: Alert and oriented x3. No focalization  Assessment & Plan:

## 2012-10-26 ENCOUNTER — Telehealth: Payer: Self-pay | Admitting: *Deleted

## 2012-10-26 ENCOUNTER — Ambulatory Visit (HOSPITAL_COMMUNITY)
Admission: RE | Admit: 2012-10-26 | Discharge: 2012-10-26 | Disposition: A | Discharge status: Home / Self Care | Payer: Medicaid Other | Source: Ambulatory Visit | Attending: Pulmonary Disease | Admitting: Pulmonary Disease

## 2012-10-26 DIAGNOSIS — R05 Cough: Secondary | ICD-10-CM | POA: Insufficient documentation

## 2012-10-26 DIAGNOSIS — R059 Cough, unspecified: Secondary | ICD-10-CM | POA: Insufficient documentation

## 2012-10-26 DIAGNOSIS — R0609 Other forms of dyspnea: Secondary | ICD-10-CM | POA: Insufficient documentation

## 2012-10-26 DIAGNOSIS — J988 Other specified respiratory disorders: Secondary | ICD-10-CM | POA: Insufficient documentation

## 2012-10-26 DIAGNOSIS — R0989 Other specified symptoms and signs involving the circulatory and respiratory systems: Secondary | ICD-10-CM | POA: Insufficient documentation

## 2012-10-26 DIAGNOSIS — R0602 Shortness of breath: Secondary | ICD-10-CM | POA: Insufficient documentation

## 2012-10-26 DIAGNOSIS — R062 Wheezing: Secondary | ICD-10-CM | POA: Insufficient documentation

## 2012-10-26 LAB — PULMONARY FUNCTION TEST

## 2012-10-26 MED ORDER — ALBUTEROL SULFATE (5 MG/ML) 0.5% IN NEBU
2.5000 mg | INHALATION_SOLUTION | Freq: Once | RESPIRATORY_TRACT | Status: AC
  Administered 2012-10-26: 2.5 mg via RESPIRATORY_TRACT

## 2012-10-26 NOTE — Telephone Encounter (Addendum)
PFT results have been received and placed in [facility] Resnik folder for review (unconfirmed results). Please advise, thanks.

## 2012-10-27 ENCOUNTER — Ambulatory Visit: Payer: Medicaid Other | Admitting: Pulmonary Disease

## 2012-10-29 ENCOUNTER — Inpatient Hospital Stay (HOSPITAL_COMMUNITY)
Admission: EM | Admit: 2012-10-29 | Discharge: 2012-10-30 | DRG: 190 | Disposition: A | Discharge status: Home / Self Care | Payer: Medicaid Other | Attending: Internal Medicine | Admitting: Internal Medicine

## 2012-10-29 ENCOUNTER — Encounter (HOSPITAL_COMMUNITY): Payer: Self-pay | Admitting: *Deleted

## 2012-10-29 ENCOUNTER — Emergency Department (HOSPITAL_COMMUNITY): Payer: Medicaid Other

## 2012-10-29 DIAGNOSIS — E669 Obesity, unspecified: Secondary | ICD-10-CM | POA: Diagnosis present

## 2012-10-29 DIAGNOSIS — G4733 Obstructive sleep apnea (adult) (pediatric): Secondary | ICD-10-CM | POA: Diagnosis present

## 2012-10-29 DIAGNOSIS — I10 Essential (primary) hypertension: Secondary | ICD-10-CM | POA: Diagnosis present

## 2012-10-29 DIAGNOSIS — M129 Arthropathy, unspecified: Secondary | ICD-10-CM | POA: Diagnosis present

## 2012-10-29 DIAGNOSIS — Z823 Family history of stroke: Secondary | ICD-10-CM

## 2012-10-29 DIAGNOSIS — M25562 Pain in left knee: Secondary | ICD-10-CM

## 2012-10-29 DIAGNOSIS — R0602 Shortness of breath: Secondary | ICD-10-CM | POA: Diagnosis present

## 2012-10-29 DIAGNOSIS — Z833 Family history of diabetes mellitus: Secondary | ICD-10-CM

## 2012-10-29 DIAGNOSIS — Z87891 Personal history of nicotine dependence: Secondary | ICD-10-CM

## 2012-10-29 DIAGNOSIS — Z794 Long term (current) use of insulin: Secondary | ICD-10-CM

## 2012-10-29 DIAGNOSIS — I5033 Acute on chronic diastolic (congestive) heart failure: Secondary | ICD-10-CM | POA: Diagnosis present

## 2012-10-29 DIAGNOSIS — F1411 Cocaine abuse, in remission: Secondary | ICD-10-CM | POA: Diagnosis present

## 2012-10-29 DIAGNOSIS — I517 Cardiomegaly: Secondary | ICD-10-CM

## 2012-10-29 DIAGNOSIS — J439 Emphysema, unspecified: Secondary | ICD-10-CM | POA: Diagnosis present

## 2012-10-29 DIAGNOSIS — Z6841 Body Mass Index (BMI) 40.0 and over, adult: Secondary | ICD-10-CM

## 2012-10-29 DIAGNOSIS — Z8249 Family history of ischemic heart disease and other diseases of the circulatory system: Secondary | ICD-10-CM

## 2012-10-29 DIAGNOSIS — F1011 Alcohol abuse, in remission: Secondary | ICD-10-CM | POA: Diagnosis present

## 2012-10-29 DIAGNOSIS — Z659 Problem related to unspecified psychosocial circumstances: Secondary | ICD-10-CM

## 2012-10-29 DIAGNOSIS — J441 Chronic obstructive pulmonary disease with (acute) exacerbation: Principal | ICD-10-CM | POA: Diagnosis present

## 2012-10-29 DIAGNOSIS — J449 Chronic obstructive pulmonary disease, unspecified: Secondary | ICD-10-CM

## 2012-10-29 DIAGNOSIS — I509 Heart failure, unspecified: Secondary | ICD-10-CM | POA: Diagnosis present

## 2012-10-29 DIAGNOSIS — J069 Acute upper respiratory infection, unspecified: Secondary | ICD-10-CM | POA: Diagnosis present

## 2012-10-29 DIAGNOSIS — E119 Type 2 diabetes mellitus without complications: Secondary | ICD-10-CM | POA: Diagnosis present

## 2012-10-29 HISTORY — DX: Type 2 diabetes mellitus without complications: E11.9

## 2012-10-29 LAB — URINALYSIS, ROUTINE W REFLEX MICROSCOPIC
Bilirubin Urine: NEGATIVE
Glucose, UA: NEGATIVE mg/dL
Specific Gravity, Urine: 1.021 (ref 1.005–1.030)
Urobilinogen, UA: 0.2 mg/dL (ref 0.0–1.0)

## 2012-10-29 LAB — CBC WITH DIFFERENTIAL/PLATELET
Basophils Relative: 0 % (ref 0–1)
Eosinophils Absolute: 0.5 10*3/uL (ref 0.0–0.7)
HCT: 36.3 % (ref 36.0–46.0)
Hemoglobin: 11.4 g/dL — ABNORMAL LOW (ref 12.0–15.0)
MCH: 26.6 pg (ref 26.0–34.0)
MCHC: 31.4 g/dL (ref 30.0–36.0)
Monocytes Absolute: 0.5 10*3/uL (ref 0.1–1.0)
Monocytes Relative: 8 % (ref 3–12)

## 2012-10-29 LAB — RAPID URINE DRUG SCREEN, HOSP PERFORMED
Barbiturates: NOT DETECTED
Tetrahydrocannabinol: NOT DETECTED

## 2012-10-29 LAB — BASIC METABOLIC PANEL
BUN: 14 mg/dL (ref 6–23)
Creatinine, Ser: 0.99 mg/dL (ref 0.50–1.10)
GFR calc Af Amer: 77 mL/min — ABNORMAL LOW (ref >90)
GFR calc non Af Amer: 66 mL/min — ABNORMAL LOW (ref >90)

## 2012-10-29 LAB — GLUCOSE, CAPILLARY
Glucose-Capillary: 187 mg/dL — ABNORMAL HIGH (ref 70–99)
Glucose-Capillary: 246 mg/dL — ABNORMAL HIGH (ref 70–99)

## 2012-10-29 LAB — TSH: TSH: 0.288 u[IU]/mL — ABNORMAL LOW (ref 0.350–4.500)

## 2012-10-29 LAB — URINE MICROSCOPIC-ADD ON

## 2012-10-29 LAB — PROTIME-INR: INR: 0.89 (ref 0.00–1.49)

## 2012-10-29 LAB — HEMOGLOBIN A1C: Hgb A1c MFr Bld: 7 % — ABNORMAL HIGH (ref <5.7)

## 2012-10-29 MED ORDER — INSULIN GLARGINE 100 UNIT/ML ~~LOC~~ SOLN
13.0000 [IU] | Freq: Two times a day (BID) | SUBCUTANEOUS | Status: DC
  Administered 2012-10-29 – 2012-10-30 (×2): 13 [IU] via SUBCUTANEOUS
  Filled 2012-10-29 (×3): qty 0.13

## 2012-10-29 MED ORDER — ALBUTEROL (5 MG/ML) CONTINUOUS INHALATION SOLN: 15.0000 mg/h | INHALATION_SOLUTION | Freq: Once | RESPIRATORY_TRACT | Status: AC

## 2012-10-29 MED ORDER — NITROGLYCERIN 2 % TD OINT
0.5000 [in_us] | TOPICAL_OINTMENT | Freq: Four times a day (QID) | TRANSDERMAL | Status: DC
  Administered 2012-10-29: 0.5 [in_us] via TOPICAL
  Filled 2012-10-29: qty 30

## 2012-10-29 MED ORDER — ALBUTEROL SULFATE (5 MG/ML) 0.5% IN NEBU: 2.5000 mg | INHALATION_SOLUTION | Freq: Three times a day (TID) | RESPIRATORY_TRACT | Status: DC

## 2012-10-29 MED ORDER — METHYLPREDNISOLONE SODIUM SUCC 125 MG IJ SOLR
125.0000 mg | Freq: Once | INTRAMUSCULAR | Status: AC
  Administered 2012-10-29: 125 mg via INTRAVENOUS
  Filled 2012-10-29: qty 2

## 2012-10-29 MED ORDER — IRBESARTAN 300 MG PO TABS
300.0000 mg | ORAL_TABLET | Freq: Every day | ORAL | Status: DC
  Administered 2012-10-30: 300 mg via ORAL
  Filled 2012-10-29 (×2): qty 1

## 2012-10-29 MED ORDER — SODIUM CHLORIDE 0.9 % IJ SOLN
3.0000 mL | Freq: Two times a day (BID) | INTRAMUSCULAR | Status: DC
  Administered 2012-10-29 – 2012-10-30 (×3): 3 mL via INTRAVENOUS

## 2012-10-29 MED ORDER — ONDANSETRON HCL 4 MG PO TABS: 4.0000 mg | ORAL_TABLET | Freq: Four times a day (QID) | ORAL | Status: DC | PRN

## 2012-10-29 MED ORDER — ALBUTEROL SULFATE (5 MG/ML) 0.5% IN NEBU
2.5000 mg | INHALATION_SOLUTION | RESPIRATORY_TRACT | Status: DC | PRN
  Filled 2012-10-29: qty 0.5

## 2012-10-29 MED ORDER — INSULIN GLARGINE 100 UNIT/ML ~~LOC~~ SOLN
17.0000 [IU] | Freq: Two times a day (BID) | SUBCUTANEOUS | Status: DC
  Administered 2012-10-29: 17 [IU] via SUBCUTANEOUS
  Filled 2012-10-29 (×2): qty 0.17

## 2012-10-29 MED ORDER — FUROSEMIDE 10 MG/ML IJ SOLN
40.0000 mg | Freq: Once | INTRAMUSCULAR | Status: AC
  Administered 2012-10-29: 40 mg via INTRAVENOUS
  Filled 2012-10-29: qty 4

## 2012-10-29 MED ORDER — POTASSIUM CHLORIDE CRYS ER 20 MEQ PO TBCR
40.0000 meq | EXTENDED_RELEASE_TABLET | Freq: Once | ORAL | Status: DC
  Filled 2012-10-29: qty 2

## 2012-10-29 MED ORDER — IPRATROPIUM BROMIDE 0.02 % IN SOLN
1.0000 mg | Freq: Once | RESPIRATORY_TRACT | Status: AC
  Administered 2012-10-29: 1 mg via RESPIRATORY_TRACT
  Filled 2012-10-29: qty 5

## 2012-10-29 MED ORDER — ALBUTEROL (5 MG/ML) CONTINUOUS INHALATION SOLN
INHALATION_SOLUTION | RESPIRATORY_TRACT | Status: AC
  Administered 2012-10-29: 15 mg/h via RESPIRATORY_TRACT
  Filled 2012-10-29: qty 20

## 2012-10-29 MED ORDER — ALBUTEROL SULFATE (5 MG/ML) 0.5% IN NEBU
2.5000 mg | INHALATION_SOLUTION | Freq: Four times a day (QID) | RESPIRATORY_TRACT | Status: AC
  Administered 2012-10-29 (×2): 2.5 mg via RESPIRATORY_TRACT
  Filled 2012-10-29: qty 0.5

## 2012-10-29 MED ORDER — TIOTROPIUM BROMIDE MONOHYDRATE 18 MCG IN CAPS
18.0000 ug | ORAL_CAPSULE | Freq: Every day | RESPIRATORY_TRACT | Status: DC
  Filled 2012-10-29: qty 5

## 2012-10-29 MED ORDER — HYDROCODONE-ACETAMINOPHEN 5-325 MG PO TABS
1.0000 | ORAL_TABLET | ORAL | Status: DC | PRN
  Administered 2012-10-29 (×2): 1 via ORAL
  Administered 2012-10-30 (×2): 2 via ORAL
  Filled 2012-10-29: qty 1
  Filled 2012-10-29: qty 2
  Filled 2012-10-29: qty 1
  Filled 2012-10-29: qty 2

## 2012-10-29 MED ORDER — AMLODIPINE BESYLATE 10 MG PO TABS
10.0000 mg | ORAL_TABLET | Freq: Every day | ORAL | Status: DC
  Administered 2012-10-30: 10 mg via ORAL
  Filled 2012-10-29 (×2): qty 1

## 2012-10-29 MED ORDER — PANTOPRAZOLE SODIUM 40 MG PO TBEC
40.0000 mg | DELAYED_RELEASE_TABLET | Freq: Every day | ORAL | Status: DC
  Administered 2012-10-30: 40 mg via ORAL
  Filled 2012-10-29: qty 1

## 2012-10-29 MED ORDER — FUROSEMIDE 10 MG/ML IJ SOLN: 20.0000 mg | Freq: Once | INTRAMUSCULAR | Status: DC

## 2012-10-29 MED ORDER — GUAIFENESIN-DM 100-10 MG/5ML PO SYRP: 5.0000 mL | ORAL_SOLUTION | ORAL | Status: DC | PRN

## 2012-10-29 MED ORDER — IPRATROPIUM BROMIDE 0.02 % IN SOLN
1.0000 mg | Freq: Once | RESPIRATORY_TRACT | Status: AC
  Administered 2012-10-29: 1 mg via RESPIRATORY_TRACT
  Filled 2012-10-29: qty 2.5

## 2012-10-29 MED ORDER — ONDANSETRON HCL 4 MG/2ML IJ SOLN: 4.0000 mg | Freq: Four times a day (QID) | INTRAMUSCULAR | Status: DC | PRN

## 2012-10-29 MED ORDER — LEVOFLOXACIN 750 MG PO TABS
750.0000 mg | ORAL_TABLET | Freq: Every day | ORAL | Status: DC
  Administered 2012-10-29 – 2012-10-30 (×2): 750 mg via ORAL
  Filled 2012-10-29 (×2): qty 1

## 2012-10-29 MED ORDER — ENOXAPARIN SODIUM 80 MG/0.8ML ~~LOC~~ SOLN
70.0000 mg | SUBCUTANEOUS | Status: DC
  Administered 2012-10-29: 70 mg via SUBCUTANEOUS
  Filled 2012-10-29 (×2): qty 0.8

## 2012-10-29 MED ORDER — ALBUTEROL SULFATE (5 MG/ML) 0.5% IN NEBU: 2.5000 mg | INHALATION_SOLUTION | Freq: Four times a day (QID) | RESPIRATORY_TRACT | Status: DC | PRN

## 2012-10-29 MED ORDER — INSULIN ASPART 100 UNIT/ML ~~LOC~~ SOLN
0.0000 [IU] | Freq: Three times a day (TID) | SUBCUTANEOUS | Status: DC
  Administered 2012-10-29: 3 [IU] via SUBCUTANEOUS
  Administered 2012-10-30: 2 [IU] via SUBCUTANEOUS

## 2012-10-29 MED ORDER — METHYLPREDNISOLONE SODIUM SUCC 125 MG IJ SOLR
80.0000 mg | Freq: Three times a day (TID) | INTRAMUSCULAR | Status: DC
  Administered 2012-10-29 – 2012-10-30 (×2): 80 mg via INTRAVENOUS
  Filled 2012-10-29 (×5): qty 1.28

## 2012-10-29 MED ORDER — AMLODIPINE BESYLATE-VALSARTAN 10-320 MG PO TABS: 1.0000 | ORAL_TABLET | Freq: Every morning | ORAL | Status: DC

## 2012-10-29 MED ORDER — IPRATROPIUM BROMIDE 0.02 % IN SOLN
RESPIRATORY_TRACT | Status: AC
  Filled 2012-10-29: qty 2.5

## 2012-10-29 NOTE — ED Provider Notes (Signed)
History     CSN: [id number]  Arrival date & time 10/29/12  4098   First MD Initiated Contact with Patient 10/29/12 [phone]      Chief Complaint  Patient presents with  . Shortness of Breath  . Cough    (Consider location/radiation/quality/duration/timing/severity/associated sxs/prior treatment) The history is provided by the patient.  Kiara Cooper is a 47 y.o. female history of CHF, COPD, hypertension here presenting with shortness of breath and cough. Intermittent cough for the last several months and has been seen several times for COPD exacerbation and finish 2 course of steroids. She said this there is usually helped her for several days and she gets worse. The last 3 days she has with shortness of breath on exertion and worse shortness of breath when she lays down to she's been sleeping on more pillows at night. Her legs are chronically swollen and got worse the last couple days. Denies any chest pain or fevers or chills. She used her albuterol frequently yesterday and not feeling better.    Past Medical History  Diagnosis Date  . Hypertension   . Arthritis   . Edema   . 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 No orthopnea. Epworth Sleepiness Scale is 10.  Plan: Polysomnography studies.    Marland Kitchen COPD (chronic obstructive pulmonary disease)   . CHF (congestive heart failure)   . Diabetes mellitus without complication     Past Surgical History  Procedure Laterality Date  . Hernia repair      Umbilical  . Cesarean section w/btl    . Tubal ligation      Family History  Problem Relation Age of Onset  . Diabetes Father   . Stroke Mother   . Coronary artery disease Maternal Aunt 77  . Coronary artery disease Maternal Grandfather 89    History  Substance Use Topics  . Smoking status: Former Smoker -- 2.00 packs/day for 30 years    Types: Cigarettes    Quit date: 09/12/2011  . Smokeless tobacco: Never  Used  . Alcohol Use: No     Comment: off alcohol since 08/22/11 -every day about 24 pack a day    OB History   Grav Para Term Preterm Abortions TAB SAB Ect Mult Living                  Review of Systems  Respiratory: Positive for cough and shortness of breath.   Cardiovascular: Positive for leg swelling.  All other systems reviewed and are negative.    Allergies  Review of patient's allergies indicates no known allergies.  Home Medications   Current Outpatient Rx  Name  Route  Sig  Dispense  Refill  . albuterol (PROVENTIL HFA;VENTOLIN HFA) 108 (90 BASE) MCG/ACT inhaler   Inhalation   Inhale 1-2 puffs into the lungs every 6 (six) hours as needed for wheezing.   1 Inhaler   0   . albuterol (PROVENTIL) (5 MG/ML) 0.5% nebulizer solution   Nebulization   Take 2.5 mg by nebulization every 4 (four) hours as needed for wheezing or shortness of breath.         Marland Kitchen amLODipine-valsartan (EXFORGE) 10-320 MG per tablet   Oral   Take 1 tablet by mouth every morning.         . insulin glargine (LANTUS) 100 UNIT/ML injection   Subcutaneous   Inject 17 Units into the skin at  bedtime.   10 mL   3   . meloxicam (MOBIC) 15 MG tablet   Oral   Take 15 mg by mouth every morning.         . metFORMIN (GLUCOPHAGE) 500 MG tablet   Oral   Take 2 tablets (1,000 mg total) by mouth 2 (two) times daily with a meal.   120 tablet   6   . omeprazole (PRILOSEC) 40 MG capsule   Oral   Take 40 mg by mouth daily.         Marland Kitchen tiotropium (SPIRIVA) 18 MCG inhalation capsule   Inhalation   Place 1 capsule (18 mcg total) into inhaler and inhale daily.   30 capsule   12     BP 140/65  Pulse 90  Temp(Src) 97.7 F (36.5 C) (Oral)  Resp 24  SpO2 99%  Physical Exam  Nursing note and vitals reviewed. Constitutional: She is oriented to person, place, and time.  Coughing, tachypneic, talking in full sentences   HENT:  Head: Normocephalic.  Mouth/Throat: Oropharynx is clear and moist.   Eyes: Conjunctivae are normal. Pupils are equal, round, and reactive to light.  Neck: Normal range of motion. Neck supple.  Cardiovascular: Normal rate, regular rhythm and normal heart sounds.   Pulmonary/Chest:  Slightly tachypneic, + diffuse wheezing, no crackles. No retractions.   Abdominal: Soft. Bowel sounds are normal. She exhibits no distension. There is no tenderness. There is no rebound.  + obese   Musculoskeletal:  2+ edema   Neurological: She is alert and oriented to person, place, and time.  Skin: Skin is warm and dry.  Psychiatric: She has a normal mood and affect. Her behavior is normal. Judgment and thought content normal.    ED Course  Procedures (including critical care time)  Labs Reviewed  CBC WITH DIFFERENTIAL - Abnormal; Notable for the following:    Hemoglobin 11.4 (*)    Eosinophils Relative 8 (*)    All other components within normal limits  BASIC METABOLIC PANEL - Abnormal; Notable for the following:    Glucose, Bld 129 (*)    GFR calc non Af Amer 66 (*)    GFR calc Af Amer 77 (*)    All other components within normal limits  PRO B NATRIURETIC PEPTIDE  TROPONIN I   Dg Chest 2 View  10/29/2012  *RADIOLOGY REPORT*  Clinical Data: Cough and congestion, shortness of breath  CHEST - 2 VIEW  Comparison: 10/12/2012  Findings: The heart and pulmonary vascularity are within normal limits.  The lungs are clear bilaterally.  No acute bony abnormality is seen.  IMPRESSION: No acute intrathoracic abnormality.   Original Report Authenticated By: Alcide Clever, M.D.      No diagnosis found.   Date: 10/29/2012  Rate: 87  Rhythm: normal sinus rhythm  QRS Axis: normal  Intervals: normal  ST/T Wave abnormalities: nonspecific ST changes  Conduction Disutrbances:none  Narrative Interpretation:   Old EKG Reviewed: unchanged    MDM  Kiara Cooper is a 47 y.o. female here with SOB. Will need to r/o COPD vs CHF exacerbation. Will get BNP, labs, CXR. Will give hour  long nebs and IV steroids and reassess.    10:12 AM After nebs and steroids, she felt better. Labs unremarkable, BNP nl. CXR showed no infiltrate. She walked to the bathroom and desat to 85%. Will require admission for COPD exacerbation.  I discussed with Dr. Thedore Mins, will admit to tele.  Richardean Canal, MD 10/29/12 [phone]

## 2012-10-29 NOTE — ED Notes (Signed)
Attempted to call report to Tess, RN on 4W, no answer, will call back.

## 2012-10-29 NOTE — Progress Notes (Signed)
  Echocardiogram 2D Echocardiogram has been performed.  Cathie Beams 10/29/2012, 12:41 PM

## 2012-10-29 NOTE — ED Notes (Signed)
Attempted to call report for 2nd time, RN remains unavailable, will monitor.

## 2012-10-29 NOTE — ED Notes (Signed)
RT at bedside for breathing tx.

## 2012-10-29 NOTE — Progress Notes (Signed)
ANTIBIOTIC CONSULT NOTE - INITIAL  Pharmacy Consult for Levaquin Indication: URI  No Known Allergies  Patient Measurements: Height: 5' 4.96" (165 cm) Weight: 324 lb 1.2 oz (147 kg) IBW/kg (Calculated) : 56.91  Vital Signs: Temp: 97.7 F (36.5 C) (03/27 0756) Temp src: Oral (03/27 0756) BP: 140/65 mmHg (03/27 0756) Pulse Rate: 90 (03/27 0756) Intake/Output from previous day:   Intake/Output from this shift: Total I/O In: -  Out: 1 [Urine:1]  Labs:  Recent Labs  10/29/12 0848  WBC 6.4  HGB 11.4*  PLT 313  CREATININE 0.99   Estimated Creatinine Clearance: 101.9 ml/min (by C-G formula based on Cr of 0.99). No results found for this basename: VANCOTROUGH, VANCOPEAK, VANCORANDOM, GENTTROUGH, GENTPEAK, GENTRANDOM, TOBRATROUGH, TOBRAPEAK, TOBRARND, AMIKACINPEAK, AMIKACINTROU, AMIKACIN,  in the last 72 hours   Microbiology: No results found for this or any previous visit (from the past 720 hour(s)).  Medical History: Past Medical History  Diagnosis Date  . Hypertension   . Arthritis   . Edema   . 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 No orthopnea. Epworth Sleepiness Scale is 10.  Plan: Polysomnography studies.    Marland Kitchen COPD (chronic obstructive pulmonary disease)   . CHF (congestive heart failure)   . Diabetes mellitus without complication     Assessment: 52 yof with h/o of COPD seen multiple times in the ED recently for COPD exacerbation, patient was sent on Avelox and steroids on 10/12/12.  Pt returned 3/27 with SOB, cough. CXR here with no acute abnormality.  MD would like to order Levaquin for upper respiratory infection.   Scr wnl, Wt 147 for CG CrCl of > 100 ml/min.  Afebrile, WBC wnl.   Plan:   Levaquin 750 mg po q24h   Pharmacy will f/u  Geoffry Paradise, PharmD, BCPS Pager: [phone] 10:58 AM Pharmacy #: [phone]

## 2012-10-29 NOTE — H&P (Signed)
[facility]                                                                                    Patient Demographics  Kiara Cooper, is a 47 y.o. female  CSN: [id number]  MRN: [id number]  DOB - 1966-02-16  Admit Date - 10/29/2012  Outpatient Primary MD for the patient is Lillia Abed, MD Pulmonologist Dr. Marcelyn Bruins   With History of -  Past Medical History  Diagnosis Date  . Hypertension   . Arthritis   . Edema   . 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 No orthopnea. Epworth Sleepiness Scale is 10.  Plan: Polysomnography studies.    Marland Kitchen COPD (chronic obstructive pulmonary disease)   . CHF (congestive heart failure)   . Diabetes mellitus without complication       Past Surgical History  Procedure Laterality Date  . Hernia repair      Umbilical  . Cesarean section w/btl    . Tubal ligation      in for   Chief Complaint  Patient presents with  . Shortness of Breath  . Cough     HPI  Kiara Cooper  is a 47 y.o. 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 Shelle Iron pulmonary for evaluation of a wheezing and shortness of breath, presents with 4-5 day history of productive cough with yellow phlegm, gradually progressive shortness of breath and orthopnea, no chest pain, no fever chills, denies any recent long travels or history of blood clots. Also has noticed mild swelling in both legs which is chronic but now certainly worse. Came to the ER where there was a initial diagnosis of shortness of breath and wheezing possible COPD exacerbation and I was called to admit the patient.    Review of Systems    In addition to the HPI above,   No Fever-chills, No Headache, No changes with Vision or hearing, No  problems swallowing food or Liquids, No Chest pain, positive productive Cough yellow phlegm and shortness of breath along with orthopnea,  No Abdominal pain, No Nausea or Vommitting, Bowel movements are regular, No Blood in stool or Urine, No dysuria, No new skin rashes or bruises, No new joints pains-aches,  No new weakness, tingling, numbness in any extremity, No recent weight gain or loss, No polyuria, polydypsia or polyphagia, No significant Mental Stressors.  A full 10 point Review of Systems was done, except as stated above, all other Review of Systems were negative.   Social History History  Substance Use Topics  . Smoking status: Former Smoker -- 2.00 packs/day for 30 years    Types: Cigarettes    Quit date: 09/12/2011  . Smokeless tobacco: Never Used  . Alcohol Use: No     Comment: off alcohol since 08/22/11 -every day about 24 pack a day      Family History Family History  Problem Relation Age of Onset  .  Diabetes Father   . Stroke Mother   . Coronary artery disease Maternal Aunt 77  . Coronary artery disease Maternal Grandfather 89      Prior to Admission medications   Medication Sig Start Date End Date Taking? Authorizing Provider  albuterol (PROVENTIL HFA;VENTOLIN HFA) 108 (90 BASE) MCG/ACT inhaler Inhale 1-2 puffs into the lungs every 6 (six) hours as needed for wheezing. 09/29/12  Yes Gerhard Munch, MD  albuterol (PROVENTIL) (5 MG/ML) 0.5% nebulizer solution Take 2.5 mg by nebulization every 4 (four) hours as needed for wheezing or shortness of breath. 10/12/12  Yes Vida Roller, MD  amLODipine-valsartan (EXFORGE) 10-320 MG per tablet Take 1 tablet by mouth every morning. 07/31/12 07/31/13 Yes Jayce G Cook, DO  insulin glargine (LANTUS) 100 UNIT/ML injection Inject 17 Units into the skin at bedtime. 07/31/12  Yes Tommie Sams, DO  meloxicam (MOBIC) 15 MG tablet Take 15 mg by mouth every morning. 10/22/12  Yes Dayarmys Piloto de Criselda Peaches, MD  metFORMIN  (GLUCOPHAGE) 500 MG tablet Take 2 tablets (1,000 mg total) by mouth 2 (two) times daily with a meal. 07/31/12  Yes Tommie Sams, DO  omeprazole (PRILOSEC) 40 MG capsule Take 40 mg by mouth daily. 10/23/12  Yes Barbaraann Share, MD  tiotropium (SPIRIVA) 18 MCG inhalation capsule Place 1 capsule (18 mcg total) into inhaler and inhale daily. 10/05/12  Yes Dayarmys Pil[place] de Criselda Peaches, MD    No Known Allergies  Physical Exam  Vitals  Blood pressure 140/65, pulse 90, temperature 97.7 F (36.5 C), temperature source Oral, resp. rate 24, SpO2 99.00%.   1. General middle-aged morbidly obese African American female lying in bed in NAD,    2. Normal affect and insight, Not Suicidal or Homicidal, Awake Alert, Oriented X 3.  3. No F.N deficits, ALL C.Nerves Intact, Strength 5/5 all 4 extremities, Sensation intact all 4 extremities, Plantars down going.  4. Ears and Eyes appear Normal, Conjunctivae clear, PERRLA. Moist Oral Mucosa.  5. Supple Neck, No JVD, No cervical lymphadenopathy appriciated, No Carotid Bruits.  6. Symmetrical Chest wall movement, Mod air movement bilaterally, bilateral wheezing, few rales at the bases  7. RRR, No Gallops, Rubs or Murmurs, No Parasternal Heave.  8. Positive Bowel Sounds, Abdomen Soft, Non tender, No organomegaly appriciated,No rebound -guarding or rigidity.  9.  No Cyanosis, Normal Skin Turgor, No Skin Rash or Bruise. Trace to 1+ bipedal edema.  10. Good muscle tone,  joints appear normal , no effusions, Normal ROM.  11. No Palpable Lymph Nodes in Neck or Axillae     Data Review  CBC  Recent Labs Lab 10/29/12 0848  WBC 6.4  HGB 11.4*  HCT 36.3  PLT 313  MCV 84.6  MCH 26.6  MCHC 31.4  RDW 14.2  LYMPHSABS 1.4  MONOABS 0.5  EOSABS 0.5  BASOSABS 0.0   ------------------------------------------------------------------------------------------------------------------  Chemistries   Recent Labs Lab 10/29/12 0848  NA 139  K 3.6  CL 101  CO2  29  GLUCOSE 129*  BUN 14  CREATININE 0.99  CALCIUM 9.1   ------------------------------------------------------------------------------------------------------------------ CrCl is unknown because both a height and weight (above a minimum accepted value) are required for this calculation. ------------------------------------------------------------------------------------------------------------------ No results found for this basename: TSH, T4TOTAL, FREET3, T3FREE, THYROIDAB,  in the last 72 hours   Coagulation profile No results found for this basename: INR, PROTIME,  in the last 168 hours ------------------------------------------------------------------------------------------------------------------- No results found for this basename: DDIMER,  in the last 72 hours -------------------------------------------------------------------------------------------------------------------  Cardiac Enzymes  Recent Labs Lab 10/29/12 0848  TROPONINI <0.30   ------------------------------------------------------------------------------------------------------------------ No components found with this basename: POCBNP,    ---------------------------------------------------------------------------------------------------------------  Urinalysis    Component Value Date/Time   COLORURINE YELLOW 07/17/2012 0020   APPEARANCEUR CLEAR 07/17/2012 0020   LABSPEC 1.038* 07/17/2012 0020   PHURINE 6.0 07/17/2012 0020   GLUCOSEU >1000* 07/17/2012 0020   HGBUR NEGATIVE 07/17/2012 0020   BILIRUBINUR NEGATIVE 07/17/2012 0020   BILIRUBINUR NEG 07/16/2012 0910   KETONESUR NEGATIVE 07/17/2012 0020   PROTEINUR NEGATIVE 07/17/2012 0020   UROBILINOGEN 0.2 07/17/2012 0020   UROBILINOGEN 0.2 07/16/2012 0910   NITRITE NEGATIVE 07/17/2012 0020   NITRITE NEG 07/16/2012 0910   LEUKOCYTESUR NEGATIVE 07/17/2012 0020     ----------------------------------------------------------------------------------------------------------------  Imaging results:   Dg Chest 2 View  10/29/2012  *RADIOLOGY REPORT*  Clinical Data: Cough and congestion, shortness of breath  CHEST - 2 VIEW  Comparison: 10/12/2012  Findings: The heart and pulmonary vascularity are within normal limits.  The lungs are clear bilaterally.  No acute bony abnormality is seen.  IMPRESSION: No acute intrathoracic abnormality.   Original Report Authenticated By: Alcide Clever, M.D.        My personal review of EKG: Rhythm NSR, no Acute ST changes    Assessment & Plan    1. Acute on chronic shortness of breath and wheezing.- She'll with history of COPD, possible start of sleep apnea who is under the evaluation and care of pulmonologist Dr. Shelle Iron, this is likely URI with mild COPD exacerbation, also on exam she is evidence of fluid overload although her pro BNP is normal. Will admit her to the hospital, IV steroids, Levaquin, IV Lasix, nebulizer and oxygen treatments as needed, obtain echo gram. Since she has a productive cough we'll also check sputum culture. Repeat 2 view chest x-ray in the morning.    2. Acute on chronic diastolic CHF.- IV Lasix now, repeat echo gram, fluid restriction and salt restriction, reassess diuretic need in the morning. Gentle nitro paste.    3. History of alcohol, cocaine and smoking in the past. She now says she has quit all, counseled to continue abstaining, check urine drug screen.    4. Diabetes mellitus type 2. Check A1c, hold Glucophage, increased Lantus as she'll be getting Solu-Medrol, sliding scale insulin q. A.c.    5. Morbid obesity with possible obstructive sleep apnea. Outpatient followup with PCP and her pulmonologist Dr. Shelle Iron.     DVT Prophylaxis  Lovenox - SCDs   AM Labs Ordered, also please review Full Orders  Family Communication: Admission, patients condition and plan of care  including tests being ordered have been discussed with the patient who indicates understanding and agree with the plan and Code Status.  Code Status full  Likely DC to  home  Time spent in minutes : 35  Condition Kiara Cooper K M.D on 10/29/2012 at 10:41 AM  Between 7am to 7pm - Pager - [phone]  After 7pm go to [email or web address] - password [email or web address]  And look for the night coverage person covering me after hours  [facility] Office  [phone]

## 2012-10-29 NOTE — ED Notes (Signed)
Pt transported to 1427 accompanied by this nurse on cardiac monitor with chart and personal belongings, condition stable at time of transfer.

## 2012-10-29 NOTE — ED Notes (Signed)
Pt from home with reports of worsening shortness of breath and cough over the last 3 days. Pt endorses hx of same as well as COPD.

## 2012-10-30 ENCOUNTER — Ambulatory Visit: Payer: Medicaid Other | Admitting: Pulmonary Disease

## 2012-10-30 ENCOUNTER — Inpatient Hospital Stay (HOSPITAL_COMMUNITY): Payer: Medicaid Other

## 2012-10-30 LAB — BASIC METABOLIC PANEL
BUN: 16 mg/dL (ref 6–23)
Chloride: 98 mEq/L (ref 96–112)
GFR calc Af Amer: 85 mL/min — ABNORMAL LOW (ref >90)
Potassium: 3.7 mEq/L (ref 3.5–5.1)
Sodium: 138 mEq/L (ref 135–145)

## 2012-10-30 LAB — MAGNESIUM: Magnesium: 1.9 mg/dL (ref 1.5–2.5)

## 2012-10-30 LAB — CBC
HCT: 37.6 % (ref 36.0–46.0)
Hemoglobin: 12.2 g/dL (ref 12.0–15.0)
MCHC: 32.4 g/dL (ref 30.0–36.0)
RBC: 4.52 MIL/uL (ref 3.87–5.11)

## 2012-10-30 LAB — TROPONIN I: Troponin I: <0.3 ng/mL (ref <0.30)

## 2012-10-30 MED ORDER — FUROSEMIDE 40 MG PO TABS: 40.0000 mg | ORAL_TABLET | Freq: Every day | ORAL | Status: DC

## 2012-10-30 MED ORDER — INSULIN ASPART 100 UNIT/ML ~~LOC~~ SOLN: SUBCUTANEOUS | Status: AC

## 2012-10-30 MED ORDER — PREDNISONE 5 MG PO TABS: ORAL_TABLET | ORAL | Status: DC

## 2012-10-30 MED ORDER — FUROSEMIDE 10 MG/ML IJ SOLN
40.0000 mg | Freq: Once | INTRAMUSCULAR | Status: AC
  Administered 2012-10-30: 40 mg via INTRAVENOUS
  Filled 2012-10-30: qty 4

## 2012-10-30 MED ORDER — ALBUTEROL SULFATE HFA 108 (90 BASE) MCG/ACT IN AERS: 1.0000 | INHALATION_SPRAY | Freq: Four times a day (QID) | RESPIRATORY_TRACT | Status: DC | PRN

## 2012-10-30 MED ORDER — FREESTYLE SYSTEM KIT: 1.0000 | PACK | Freq: Three times a day (TID) | Status: AC

## 2012-10-30 MED ORDER — ALBUTEROL SULFATE (5 MG/ML) 0.5% IN NEBU: 2.5000 mg | INHALATION_SOLUTION | Freq: Four times a day (QID) | RESPIRATORY_TRACT | Status: DC

## 2012-10-30 MED ORDER — POTASSIUM CHLORIDE CRYS ER 20 MEQ PO TBCR
20.0000 meq | EXTENDED_RELEASE_TABLET | Freq: Once | ORAL | Status: AC
  Administered 2012-10-30: 20 meq via ORAL
  Filled 2012-10-30: qty 1

## 2012-10-30 MED ORDER — POTASSIUM CHLORIDE ER 10 MEQ PO TBCR: 10.0000 meq | EXTENDED_RELEASE_TABLET | Freq: Every day | ORAL | Status: DC

## 2012-10-30 MED ORDER — LEVOFLOXACIN 750 MG PO TABS: 750.0000 mg | ORAL_TABLET | Freq: Every day | ORAL | Status: DC

## 2012-10-30 NOTE — Progress Notes (Signed)
CSW was contacted for aid to transportation for patient via taxi cab. CSW met with patient to confirm that she will be returning to [facility]. RN, Tess to call [employer] (ph#: [phone]) when ready. Rn provided with completed taxi cab voucher - for patient to give to taxi driver. CSW reported plans to nursing staff & signing off.   Unice Bailey, LCSW [facility] Clinical Social Worker cell #: [phone]

## 2012-10-30 NOTE — Discharge Summary (Signed)
[facility]                                                                                   Lashanda Storlie, is a 47 y.o. female  DOB 01/15/1966  MRN [id number].  Admission date:  10/29/2012  Discharge Date:  10/30/2012  Primary MD  Lillia Abed, MD  Admitting Physician  Leroy Sea, MD  Admission Diagnosis  Shortness of breath [786.05] COPD exacerbation [491.21] Diabetes mellitus, new onset [250.00] H/O alcohol abuse [305.03]  Discharge Diagnosis     Active Problems:   Shortness of breath   Obesity   Hx of cocaine abuse   H/O alcohol abuse   HTN (hypertension)   COPD, moderate   Severe obstructive sleep apnea   Diabetes mellitus, new onset    Past Medical History  Diagnosis Date  . Hypertension   . Arthritis   . Edema   . 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 No orthopnea. Epworth Sleepiness Scale is 10.  Plan: Polysomnography studies.    Marland Kitchen COPD (chronic obstructive pulmonary disease)   . CHF (congestive heart failure)   . Diabetes mellitus without complication     Past Surgical History  Procedure Laterality Date  . Hernia repair      Umbilical  . Cesarean section w/btl    . Tubal ligation       Recommendations for primary care physician for things to follow:   Follow weight and BMP closely, adjust Lasix and Kdur dose as needed.   Discharge Diagnoses:   Active Problems:   Shortness of breath   Obesity   Hx of cocaine abuse   H/O alcohol abuse   HTN (hypertension)   COPD, moderate   Severe obstructive sleep apnea   Diabetes mellitus, new onset    Discharge Condition: stable   Diet recommendation: See Discharge Instructions below   Consults none    History of present illness and  Hospital Course:     Kindly see H&P for history of present illness and admission details, please review complete Labs, Consult reports and Test reports for all  details in brief Rejoice Heatwole, is a 47 y.o. 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 Upper airway stridor for which she is following Dr Shelle Iron and will continue to do so post DC. She also follows with cardiology [place] and requested to see them in a week. Echo here stable as below, trop -ve x 3, will DC on Lasix, KDur, Levaquin and steroid taper. CXR 2 view stable.    Will request PCP to please follow weight and BMP closely, adjust Lasix and Kdur dose as needed.     Echo  Left ventricle: The cavity size was normal. Wall thickness was increased in a pattern of moderate LVH. Systolic function was normal. The estimated ejection fraction was in the range of 60% to 65%. There was dynamic obstruction. Wall  motion was normal; there were no regional wall motion abnormalities. Doppler parameters are consistent with abnormal left ventricular relaxation (grade 1 diastolic dysfunction).      Today   Subjective:   Barba Solt today has no headache,no chest abdominal pain,no new weakness tingling or numbness, feels much better wants to go home today.    Objective:   Blood pressure 120/71, pulse 79, temperature 98.3 F (36.8 C), temperature source Oral, resp. rate 18, height 5\' 5"  (1.651 m), weight 142.9 kg (315 lb 0.6 oz), SpO2 93.00%.   Intake/Output Summary (Last 24 hours) at 10/30/12 0923 Last data filed at 10/29/12 1900  Gross per 24 hour  Intake    720 ml  Output      2 ml  Net    718 ml    Exam Awake Alert, Oriented *3, No new F.N deficits, Normal affect McBride.AT,PERRAL Supple Neck,No JVD, No cervical lymphadenopathy appriciated.  Symmetrical Chest wall movement, Good air movement bilaterally, minimal wheezing RRR,No Gallops,Rubs or new Murmurs, No Parasternal Heave +ve B.Sounds, Abd Soft, Non  tender, No organomegaly appriciated, No rebound -guarding or rigidity. No Cyanosis, Clubbing or edema, No new Rash or bruise  Data Review   Major procedures and Radiology Reports - PLEASE review detailed and final reports for all details in brief -       Dg Chest 2 View  10/30/2012  *RADIOLOGY REPORT*  Clinical Data: Shortness of breath  CHEST - 2 VIEW  Comparison: 10/29/2012  Findings: The heart and pulmonary vascularity are stable.  The lungs are clear bilaterally.  No acute bony abnormality is seen.  IMPRESSION: No acute abnormality noted.   Original Report Authenticated By: Alcide Clever, M.D.    Dg Chest 2 View  10/29/2012  *RADIOLOGY REPORT*  Clinical Data: Cough and congestion, shortness of breath  CHEST - 2 VIEW  Comparison: 10/12/2012  Findings: The heart and pulmonary vascularity are within normal limits.  The lungs are clear bilaterally.  No acute bony abnormality is seen.  IMPRESSION: No acute intrathoracic abnormality.   Original Report Authenticated By: Alcide Clever, M.D.    Dg Chest 2 View  10/12/2012  *RADIOLOGY REPORT*  Clinical Data: Shortness of breath, wheezing  CHEST - 2 VIEW  Comparison: 09/29/2012  Findings: Hypoaeration.  Interstitial and vascular crowding. Cardiomediastinal prominence is similar to prior.  No confluent airspace opacity.  No pleural effusion or pneumothorax.  No acute osseous finding.  IMPRESSION: Prominent cardiomediastinal contours are similar to prior.  Hypoaeration with interstitial vascular crowding.  No confluent airspace opacity.   Original Report Authenticated By: Jearld Lesch, M.D.     Micro Results      No results found for this or any previous visit (from the past 240 hour(s)).   CBC w Diff: Lab Results  Component Value Date   WBC 14.3* 10/30/2012   HGB 12.2 10/30/2012   HCT 37.6 10/30/2012   PLT 352 10/30/2012   LYMPHOPCT 22 10/29/2012   MONOPCT 8 10/29/2012   EOSPCT 8* 10/29/2012   BASOPCT 0 10/29/2012    CMP: Lab Results   Component Value Date   NA 138 10/30/2012   K 3.7 10/30/2012   CL 98 10/30/2012   CO2 27 10/30/2012   BUN 16 10/30/2012   CREATININE 0.91 10/30/2012   CREATININE 1.69* 07/16/2012   PROT 7.4 09/29/2012   ALBUMIN 3.9 09/29/2012   BILITOT 0.2* 09/29/2012   ALKPHOS 70 09/29/2012   AST 18 09/29/2012   ALT 16 09/29/2012  .  Discharge Instructions     Follow with Primary MD Lillia Abed, MD in 3 days   Get CBC, CMP, checked 3 days by Primary MD and again as instructed by your Primary MD.    Get Medicines reviewed and adjusted.  Accuchecks 4 times/day, Once in AM empty stomach and then before each meal. Log in all results and show them to your Prim.MD in 3 days. If any glucose reading is under 80 or above 300 call your Prim MD immidiately. Follow Low glucose instructions for glucose under 80 as instructed.   Please request your Prim.MD to go over all Hospital Tests and Procedure/Radiological results at the follow up, please get all Hospital records sent to your Prim MD by signing hospital release before you go home.  Activity: As tolerated with Full fall precautions use walker/cane & assistance as needed   Diet: Heart Healthy - Low Carb,  Fluid restriction 1.8 lit/day, Aspiration precautions.   Check your Weight same time everyday, if you gain over 2 pounds, or you develop in leg swelling, experience more shortness of breath or chest pain, call your Primary MD immediately. Follow Cardiac Low Salt Diet and 1.8 lit/day fluid restriction.  Disposition Home    If you experience worsening of your admission symptoms, develop shortness of breath, life threatening emergency, suicidal or homicidal thoughts you must seek medical attention immediately by calling 911 or calling your MD immediately  if symptoms less severe.  You Must read complete instructions/literature along with all the possible adverse reactions/side effects for all the Medicines you take and that have been prescribed to you.  Take any new Medicines after you have completely understood and accpet all the possible adverse reactions/side effects.   Do not drive and provide baby sitting services if your were admitted for syncope or siezures until you have seen by Primary MD or a Neurologist and advised to do so again.  Do not drive when taking Pain medications.    Do not take more than prescribed Pain, Sleep and Anxiety Medications  Special Instructions: If you have smoked or chewed Tobacco  in the last 2 yrs please stop smoking, stop any regular Alcohol  and or any Recreational drug use.  Wear Seat belts while driving.     Follow-up Information   Follow up with Lillia Abed, MD. Schedule an appointment as soon as possible for a visit in 3 days.   Contact information:   [address] [place] [address] [phone]       Follow up with Barbaraann Share, MD. Schedule an appointment as soon as possible for a visit in 1 week.   Contact information:   [address] [place] [address] [phone]       Follow up with Your Cardiologist. Schedule an appointment as soon as possible for a visit in 1 week.        Discharge Medications     Medication List    TAKE these medications       albuterol 108 (90 BASE) MCG/ACT inhaler  Commonly known as:  PROVENTIL HFA;VENTOLIN HFA  Inhale 1-2 puffs into the lungs every 6 (six) hours as needed for wheezing.     albuterol (5 MG/ML) 0.5% nebulizer solution  Commonly known as:  PROVENTIL  Take 2.5 mg by nebulization every 4 (four) hours as needed for wheezing or shortness of breath.     amLODipine-valsartan 10-320 MG per tablet  Commonly known as:  EXFORGE  Take 1 tablet by mouth  every morning.     furosemide 40 MG tablet  Commonly known as:  LASIX  Take 1 tablet (40 mg total) by mouth daily.     glucose monitoring kit monitoring kit  1 each by Does not apply route 4 (four) times daily - after meals and at bedtime. 1 month Diabetic Testing  Supplies for QAC-QHS accuchecks.     insulin aspart 100 UNIT/ML injection  Commonly known as:  NOVOLOG  Before each meal 3 times a day, 140-199 - 2 units, 200-250 - 4 units, 251-299 - 6 units,  300-349 - 8 units,  350 or above 10 units.  Dispense syringes and needles as needed, Ok to switch to PEN if approved.     insulin glargine 100 UNIT/ML injection  Commonly known as:  LANTUS  Inject 17 Units into the skin at bedtime.     levofloxacin 750 MG tablet  Commonly known as:  LEVAQUIN  Take 1 tablet (750 mg total) by mouth daily.     meloxicam 15 MG tablet  Commonly known as:  MOBIC  Take 15 mg by mouth every morning.     metFORMIN 500 MG tablet  Commonly known as:  GLUCOPHAGE  Take 2 tablets (1,000 mg total) by mouth 2 (two) times daily with a meal.     omeprazole 40 MG capsule  Commonly known as:  PRILOSEC  Take 40 mg by mouth daily.     potassium chloride 10 MEQ tablet  Commonly known as:  K-DUR  Take 1 tablet (10 mEq total) by mouth daily.     predniSONE 5 MG tablet  Commonly known as:  DELTASONE  Label  & dispense according to the schedule below. 10 Pills PO for 3 days then, 8 Pills PO for 3 days, 6 Pills PO for 3 days, 4 Pills PO for 3 days, 2 Pills PO for 3 days, 1 Pills PO for 3 days, 1/2 Pill  PO for 3 days then STOP. Total 95 pills.     tiotropium 18 MCG inhalation capsule  Commonly known as:  SPIRIVA  Place 1 capsule (18 mcg total) into inhaler and inhale daily.           Total Time in preparing paper work, data evaluation and todays exam - 35 minutes  Leroy Sea M.D on 10/30/2012 at 9:23 AM  [facility] Office  [phone]

## 2012-10-31 LAB — URINE CULTURE: Colony Count: 25000

## 2012-11-11 ENCOUNTER — Ambulatory Visit: Payer: Medicaid Other | Admitting: Pulmonary Disease

## 2012-11-16 ENCOUNTER — Ambulatory Visit (INDEPENDENT_AMBULATORY_CARE_PROVIDER_SITE_OTHER): Payer: Medicaid Other | Admitting: Family Medicine

## 2012-11-16 ENCOUNTER — Encounter: Payer: Self-pay | Admitting: Family Medicine

## 2012-11-16 VITALS — BP 155/88 | HR 78 | Temp 98.0°F | Ht 65.0 in | Wt 326.2 lb

## 2012-11-16 DIAGNOSIS — E119 Type 2 diabetes mellitus without complications: Secondary | ICD-10-CM

## 2012-11-16 DIAGNOSIS — I509 Heart failure, unspecified: Secondary | ICD-10-CM

## 2012-11-16 DIAGNOSIS — I5030 Unspecified diastolic (congestive) heart failure: Secondary | ICD-10-CM

## 2012-11-16 DIAGNOSIS — J449 Chronic obstructive pulmonary disease, unspecified: Secondary | ICD-10-CM

## 2012-11-16 NOTE — Patient Instructions (Addendum)
It has been a pleasure to see you today. Please take the medications as prescribed. Make your next appointment in 1 month I will order labs you can make an appointment before your visit to get them done or we can do it during your next visit.

## 2012-11-16 NOTE — Progress Notes (Signed)
Family Medicine Office Visit Note   Subjective:   Patient ID: Kiara Cooper, female  DOB: 09-Nov-1965, 47 y.o.. MRN: [id number]   Pt that comes today for follow up her recent hospitalization for COPD exacerbation. She was treated with steroid, antibiotic (levofloxacine) and also with Lasix and her breathing improved. Although her BNP was never high, CXR negative, ECHO was unchanged (pt has diagnosis of CHF diastolic grade I) At this time she has finished abx and steroid taper and has noticed that when she is off steroids her breathing worsens, but at the same time is concerned because she knows the effects on her DM. Pt will follow up with Pulmonology next Thursday.  Review of Systems:  Per HPI  Objective:   Physical Exam: Gen:  NAD HEENT: Moist mucous membranes  CV: Regular rate and rhythm, no murmurs rubs or gallops PULM: Clear to auscultation bilaterally. Mild scattered wheezes.  ABD: Soft, non tender, non distended, normal bowel sounds EXT: trace edema Neuro: Alert and oriented x3. No focalization  Assessment & Plan:

## 2012-11-18 MED ORDER — INSULIN GLARGINE 100 UNIT/ML ~~LOC~~ SOLN: 17.0000 [IU] | Freq: Every day | SUBCUTANEOUS | Status: AC

## 2012-11-18 MED ORDER — FUROSEMIDE 40 MG PO TABS: 40.0000 mg | ORAL_TABLET | Freq: Every day | ORAL | Status: DC

## 2012-11-18 MED ORDER — METFORMIN HCL 500 MG PO TABS: 1000.0000 mg | ORAL_TABLET | Freq: Two times a day (BID) | ORAL | Status: AC

## 2012-11-18 MED ORDER — POTASSIUM CHLORIDE ER 10 MEQ PO TBCR: 10.0000 meq | EXTENDED_RELEASE_TABLET | Freq: Every day | ORAL | Status: AC

## 2012-11-18 NOTE — Assessment & Plan Note (Signed)
Recently  Hospitalized. No SOB today, mild wheezes. Decline duoneb nebulizer in our office. Pt is on Spriva and Abuterol(PRN) and reports compliance. P/ Pt has  f/u with Pulmonology soon (couple of days) Continue current regimen. F/u here in 1 month will obtain labs (BNP and Bmet) on that visit.

## 2012-11-19 ENCOUNTER — Encounter: Payer: Self-pay | Admitting: Pulmonary Disease

## 2012-11-19 ENCOUNTER — Ambulatory Visit (INDEPENDENT_AMBULATORY_CARE_PROVIDER_SITE_OTHER): Payer: Medicaid Other | Admitting: Pulmonary Disease

## 2012-11-19 VITALS — BP 148/98 | HR 67 | Temp 98.4°F | Ht 65.0 in | Wt 332.6 lb

## 2012-11-19 DIAGNOSIS — R0602 Shortness of breath: Secondary | ICD-10-CM

## 2012-11-19 DIAGNOSIS — J438 Other emphysema: Secondary | ICD-10-CM

## 2012-11-19 DIAGNOSIS — J439 Emphysema, unspecified: Secondary | ICD-10-CM

## 2012-11-19 MED ORDER — BUDESONIDE-FORMOTEROL FUMARATE 160-4.5 MCG/ACT IN AERO: 2.0000 | INHALATION_SPRAY | Freq: Two times a day (BID) | RESPIRATORY_TRACT | Status: DC

## 2012-11-19 MED ORDER — ALBUTEROL SULFATE HFA 108 (90 BASE) MCG/ACT IN AERS: 1.0000 | INHALATION_SPRAY | Freq: Four times a day (QID) | RESPIRATORY_TRACT | Status: DC | PRN

## 2012-11-19 MED ORDER — ALBUTEROL SULFATE (2.5 MG/3ML) 0.083% IN NEBU: 2.5000 mg | INHALATION_SOLUTION | RESPIRATORY_TRACT | Status: AC | PRN

## 2012-11-19 MED ORDER — OMEPRAZOLE 40 MG PO CPDR: 40.0000 mg | DELAYED_RELEASE_CAPSULE | Freq: Two times a day (BID) | ORAL | Status: AC

## 2012-11-19 NOTE — Progress Notes (Signed)
  Subjective:    Patient ID: Kiara Cooper, female    DOB: 1965/10/22, 47 y.o.   MRN: [id number]  HPI The patient comes in today for followup of her dyspnea on exertion.  She has had PFTs since the last visit which show severe airflow obstruction, but a very significant response to bronchodilators.  She did not have restriction total lung capacity, and her DLCO was 65% of predicted but corrected with alveolar volume adjustment.  Since her last visit, she has been admitted to the hospital for a COPD exacerbation.  She was discharged home on a prednisone taper and her usual Spiriva with rescue medication.  The patient states that she did very well while on prednisone, but her symptoms worsen any time she comes off the medication.  Currently she has no chest congestion or purulent mucus.   Review of Systems  Constitutional: Negative for fever and unexpected weight change.  HENT: Negative for ear pain, nosebleeds, congestion, sore throat, rhinorrhea, sneezing, trouble swallowing, dental problem, postnasal drip and sinus pressure.   Eyes: Negative for redness and itching.  Respiratory: Positive for cough, chest tightness, shortness of breath and wheezing.   Cardiovascular: Negative for palpitations and leg swelling.  Gastrointestinal: Negative for nausea and vomiting.  Genitourinary: Negative for dysuria.  Musculoskeletal: Negative for joint swelling.  Skin: Negative for rash.  Neurological: Negative for headaches.  Hematological: Does not bruise/bleed easily.  Psychiatric/Behavioral: Negative for dysphoric mood. The patient is not nervous/anxious.        Objective:   Physical Exam Morbidly obese female in no acute distress Nose without purulence or discharge noted Neck without lymphadenopathy or thyromegaly Chest clear to auscultation, no true wheezing, loud upper airway pseudo-wheezing Cardiac exam with regular rate and rhythm Lower extremities with 1+ edema, no cyanosis Alert and oriented,  moves all 4 extremities.       Assessment & Plan:

## 2012-11-19 NOTE — Addendum Note (Signed)
Addended by: Nita Sells on: 11/19/2012 09:47 AM   Modules accepted: Orders

## 2012-11-19 NOTE — Assessment & Plan Note (Signed)
The patient has severe airflow obstruction based on PFTs, but she also has a 24% improvement with bronchodilators.  This combined with her dramatic response to prednisone, makes me think that she has an asthmatic component on top of her emphysema.  I would like to start her on a LABA/ICS, and we'll add this to her Spiriva.  I have also stressed to her the importance of aggressive weight loss.

## 2012-11-19 NOTE — Patient Instructions (Addendum)
Stay on spiriva one inhalation each am everyday Start symbicort 160/4.5  2 inhalations am and pm everyday .  Rinse mouth well.  Use your albuterol inhaler for rescue only.  Do not use regularly Use your albuterol nebulizer for rescue only.  Try not to use unless emergency.  Always sit down for a few minutes to see if your breathing improves prior to reaching for your rescue medication.  Will send in prescriptions for albuterol and symbicort. Stay on your reflux medication (omeprazole) in am and pm.  Would like to see you back in 6 weeks.

## 2012-11-19 NOTE — Assessment & Plan Note (Signed)
The patient has significant dyspnea on exertion that is multifactorial.  She has significant airflow obstruction by PFTs, is morbidly obese, and probably has diastolic heart failure.  I have stressed to her the importance of aggressive weight loss.

## 2012-12-08 ENCOUNTER — Encounter: Payer: Self-pay | Admitting: Family Medicine

## 2012-12-08 ENCOUNTER — Ambulatory Visit (INDEPENDENT_AMBULATORY_CARE_PROVIDER_SITE_OTHER): Payer: Medicaid Other | Admitting: Family Medicine

## 2012-12-08 VITALS — BP 123/77 | HR 77 | Temp 97.6°F | Ht 65.0 in | Wt 324.3 lb

## 2012-12-08 DIAGNOSIS — M25562 Pain in left knee: Secondary | ICD-10-CM

## 2012-12-08 DIAGNOSIS — M25569 Pain in unspecified knee: Secondary | ICD-10-CM

## 2012-12-08 MED ORDER — METHYLPREDNISOLONE ACETATE 40 MG/ML IJ SUSP
40.0000 mg | Freq: Once | INTRAMUSCULAR | Status: AC
  Administered 2012-12-08: 40 mg via INTRA_ARTICULAR

## 2012-12-08 NOTE — Patient Instructions (Addendum)
Knee Injection AFTER THE PROCEDURE   You can go home after the procedure.  You may need to put ice on the joint 15 to 20 minutes every 3 or 4 hours until the pain goes away.  You may need to put an elastic bandage on the joint. HOME CARE INSTRUCTIONS   Only take over-the-counter or prescription medicines for pain, discomfort, or fever as directed by your caregiver.  You should avoid stressing the joint. Unless advised otherwise, avoid activities that put a lot of pressure on a knee joint, such as:  Jogging.  Bicycling.  Recreational climbing.  Hiking.  Laying down and elevating the leg/knee above the level of your heart can help to minimize swelling. SEEK MEDICAL CARE IF:   You have repeated or worsening swelling.  There is drainage from the puncture area.  You develop red streaking that extends above or below the site where the needle was inserted. SEEK IMMEDIATE MEDICAL CARE IF:   You develop a fever.  You have pain that gets worse even though you are taking pain medicine.  The area is red and warm, and you have trouble moving the joint. MAKE SURE YOU:   Understand these instructions.  Will watch your condition.  Will get help right away if you are not doing well or get worse. Document Released: 10/13/2006 Document Revised: 10/14/2011 Document Reviewed: 07/10/2007 [facility] Patient Information 2013 [place], [place].

## 2012-12-11 NOTE — Progress Notes (Signed)
Family Medicine Office Visit Note   Subjective:   Patient ID: Kiara Cooper, female  DOB: 29-Sep-1965, 47 y.o.. MRN: [id number]   Pt that comes today complaining of right and left knee pain, mostly on the left at this time. She has been like this for years with slowly progressing pain. She denies other joints affected. Pt reports pain is worse with walking. Denies weakness or numbness. Pt reports tylenol and NSAIDs sometimes help but her pain control is limited due to the inability to take narcotics (refer to PMHx). She had ordered since last 02/2012 knee imagine studies that she never got done. She reports having steroid injection x1 in the past that helped.  Review of Systems:  Pt denies SOB, chest pain, palpitations, headaches, dizziness, numbness or weakness. No changes on urinary or BM habits. No unintentional weigh loss/gain.  Objective:   Physical Exam: Gen:  NAD HEENT: Moist mucous membranes  CV: Regular rate and rhythm, no murmurs PULM: Clear to auscultation bilaterally. No wheezes/rales/rhonchi MSK: left  And right knee with crepitus to extension. Mild decrease on ROM due to pain. Tenderness on lateral and medial aspect of both knees. Mild left and right knee joint effusion, no erythema. No joint laxity. No other joints involved. Skin: no rashes.  Assessment & Plan:   Knee  Injection Procedure Note  Pre-operative Diagnosis: left knee DJD  Post-operative Diagnosis: left knee DJD  Procedure Details   Verbal consent was obtained for the procedure. The joint was prepped with Betadine. A 22 gauge needle was inserted into the joint from a medial approach.  3 ml 1% lidocaine and 1 ml of triamcinolone (KENALOG) 40mg /ml was then injected into the joint through the same needle. The needle was removed and the area cleansed and dressed.  Complications:  None; patient tolerated the procedure well.

## 2012-12-11 NOTE — Assessment & Plan Note (Addendum)
Pt did not get Xray ordered but reports pain in the knee has progressively worsen. Chronic condition. No erythema, mild knee effusion. Same symptoms but less prominent on her Right Knee.  P/  Knee steroid injection discussed and done today (only one joint for now, since pt has DM and HTN) Encourage to lose weight.  F/u as needed

## 2013-01-01 ENCOUNTER — Ambulatory Visit: Payer: Medicaid Other | Admitting: Pulmonary Disease

## 2013-01-22 ENCOUNTER — Ambulatory Visit: Payer: Medicaid Other | Admitting: Pulmonary Disease

## 2013-01-26 ENCOUNTER — Telehealth: Payer: Self-pay | Admitting: Family Medicine

## 2013-01-26 NOTE — Telephone Encounter (Signed)
This papers in order to be properly filled require a complete office visit for this only purpose. Pt should schedule this visit.

## 2013-01-26 NOTE — Telephone Encounter (Signed)
[facility] office called to check on status of papers that were sent to be filled out for patient back in May.  I printed off a copy and place in your box.  Please fax to [phone] when completed.

## 2013-02-12 ENCOUNTER — Other Ambulatory Visit: Payer: Medicaid Other

## 2013-02-12 ENCOUNTER — Encounter: Payer: Self-pay | Admitting: Pulmonary Disease

## 2013-02-12 ENCOUNTER — Ambulatory Visit (INDEPENDENT_AMBULATORY_CARE_PROVIDER_SITE_OTHER): Payer: Medicaid Other | Admitting: Pulmonary Disease

## 2013-02-12 VITALS — BP 148/88 | HR 82 | Temp 97.8°F | Ht 65.0 in | Wt 326.8 lb

## 2013-02-12 DIAGNOSIS — J438 Other emphysema: Secondary | ICD-10-CM

## 2013-02-12 DIAGNOSIS — J439 Emphysema, unspecified: Secondary | ICD-10-CM

## 2013-02-12 NOTE — Progress Notes (Signed)
  Subjective:    Patient ID: Kiara Cooper, female    DOB: 1965-11-25, 47 y.o.   MRN: [id number]  HPI The patient comes in today for followup of her known obstructive lung disease.  This is felt secondary to emphysema with a component of asthma.  It is really unclear which is the dominant process, but she was started on symbicort the last visit and has had a very good response.  She stopped using the Spiriva, and just assumed that that we did not want her on both.  She has not had increasing symptoms, cough, congestion since last visit.   Review of Systems  Constitutional: Negative for fever and unexpected weight change.  HENT: Negative for ear pain, nosebleeds, congestion, sore throat, rhinorrhea, sneezing, trouble swallowing, dental problem, postnasal drip and sinus pressure.   Eyes: Negative for redness and itching.  Respiratory: Positive for shortness of breath. Negative for cough, chest tightness and wheezing.   Cardiovascular: Negative for palpitations and leg swelling.  Gastrointestinal: Negative for nausea and vomiting.  Genitourinary: Negative for dysuria.  Musculoskeletal: Negative for joint swelling.  Skin: Negative for rash.  Neurological: Negative for headaches.  Hematological: Does not bruise/bleed easily.  Psychiatric/Behavioral: Negative for dysphoric mood. The patient is not nervous/anxious.        Objective:   Physical Exam Morbidly obese female in no acute distress Nose without purulent discharge noted Neck without lymphadenopathy or thyromegaly Chest with mildly decreased breath sounds, no wheezing Cardiac exam with regular rate and rhythm Lower extremities with 1+ edema, no cyanosis Alert and oriented, moves all her extremities.       Assessment & Plan:

## 2013-02-12 NOTE — Assessment & Plan Note (Signed)
The patient has done very well with the addition of symbicort to her regimen, and this was expected given her excellent bronchodilator response on PFTs and the fact that she has a component of asthma.  It is unclear whether she needs Spiriva as well, and I would like to leak her on symbicort cologne for now since she is doing well.  I have also asked her to work hard on weight loss, and explained how this will greatly help her breathing.  Finally, I think she needs to have an alpha-1 antitrypsin level checked given the severely of her airflow obstruction at such an early age.

## 2013-02-12 NOTE — Patient Instructions (Addendum)
Stay on symbicort for now, and can use albuterol as needed.  Let me know if your breathing is not doing well, and we can consider adding spiriva. Will check for the hereditary form of emphysema with a blood test today.  Will call you with results. Work on weight loss.  This will really help your breathing. followup with me in 4mos if you are doing well, but call if having breathing issues.

## 2013-02-18 LAB — ALPHA-1 ANTITRYPSIN PHENOTYPE: A-1 Antitrypsin: 125 mg/dL (ref 83–199)

## 2013-03-11 ENCOUNTER — Telehealth: Payer: Self-pay | Admitting: Family Medicine

## 2013-03-11 NOTE — Telephone Encounter (Signed)
Paperwork placed in your box to be filled out for patient at her visit on 08/15.  Please fax back when completed after the appointment.

## 2013-03-19 ENCOUNTER — Ambulatory Visit (INDEPENDENT_AMBULATORY_CARE_PROVIDER_SITE_OTHER): Payer: Medicaid Other | Admitting: Family Medicine

## 2013-03-19 ENCOUNTER — Encounter: Payer: Self-pay | Admitting: Family Medicine

## 2013-03-19 VITALS — BP 131/79 | HR 78 | Temp 97.9°F | Ht 65.0 in | Wt 318.6 lb

## 2013-03-19 DIAGNOSIS — E119 Type 2 diabetes mellitus without complications: Secondary | ICD-10-CM

## 2013-03-19 DIAGNOSIS — IMO0001 Reserved for inherently not codable concepts without codable children: Secondary | ICD-10-CM | POA: Insufficient documentation

## 2013-03-19 DIAGNOSIS — Z0271 Encounter for disability determination: Secondary | ICD-10-CM

## 2013-03-19 DIAGNOSIS — E669 Obesity, unspecified: Secondary | ICD-10-CM

## 2013-03-23 ENCOUNTER — Telehealth: Payer: Self-pay | Admitting: *Deleted

## 2013-03-23 NOTE — Telephone Encounter (Signed)
Pt would like to know what can be taken OTC for cough. Nonproductive cough - recently started resumed smoking after quitting. Suggested that pt stop smoking , try otc sugar free cough med or mucinex with increased fluids. Pt verbalized understanding. Wyatt Haste, RN-BSN

## 2013-03-29 ENCOUNTER — Other Ambulatory Visit: Payer: Self-pay | Admitting: *Deleted

## 2013-03-29 MED ORDER — AMLODIPINE BESYLATE-VALSARTAN 10-320 MG PO TABS: 1.0000 | ORAL_TABLET | Freq: Every morning | ORAL | Status: DC

## 2013-03-29 NOTE — Assessment & Plan Note (Signed)
Discussed with attending Dr. Leveda Anna who recommended Physical Therapy evaluation in order to better fill form, since requested specificities required specialist evaluation regarding pt level of functionality.

## 2013-03-29 NOTE — Patient Instructions (Addendum)
Continue same therapy for your Diabetes. Your A1C is 6.2 ( on target below 7) Regarding paperwok for Disability you need further evaluation with Physical Therapy. A referral has been placed. Continue making good choices regarding diet. You can add a walk of 30 min three time a week to help achieving your goals regarding weight loss and health.

## 2013-03-29 NOTE — Progress Notes (Signed)
Family Medicine Office Visit Note   Subjective:   Patient ID: Kiara Cooper, female  DOB: [birth year], 47 y.o.. MRN: [id number]   Pt that comes today to f/u DM. She reports using metformin and Lantus 17 units and has not have the need to use Novolog fro correction. She denies symptoms of hypo or hyperglycemia.    She also comes today to fill paperwork sent by her Lawyer to address disability. She reports has had medical disability evaluation done in the past and will provide her Lawyer with this information as well.   Update on  Her social situation: Pt moved from [facility] and lives/ works in [place]. She only comes to [facility] for doctors appointments.  Review of Systems:  Pt denies SOB, chest pain, palpitations, headaches, dizziness, numbness or weakness. No changes on urinary or BM habits. No unintentional weigh loss/gain.  Objective:   Physical Exam: Gen:  morbidly obese NAD HEENT: Moist mucous membranes  CV: Regular rate and rhythm, no murmurs rubs or gallops PULM: Clear to auscultation bilaterally. No wheezes/rales/rhonchi ABD: Soft, non tender, non distended, normal bowel sounds EXT: No edema Neuro: Alert and oriented x3. No focalization  Assessment & Plan:

## 2013-03-29 NOTE — Assessment & Plan Note (Signed)
Pt has loss 8lb.  P/ Positive reinforcement with achievement Continue with life style changes regarding diet. Encourage to walk at least 30 min 3 times a week.

## 2013-03-29 NOTE — Assessment & Plan Note (Signed)
A1c at goal. No side effects of medication. P/ No changes incurrent therapy.

## 2013-04-06 ENCOUNTER — Ambulatory Visit: Payer: Medicaid Other | Admitting: Family Medicine

## 2013-04-28 ENCOUNTER — Ambulatory Visit (INDEPENDENT_AMBULATORY_CARE_PROVIDER_SITE_OTHER): Payer: Medicaid Other | Admitting: Family Medicine

## 2013-04-28 ENCOUNTER — Encounter: Payer: Self-pay | Admitting: Family Medicine

## 2013-04-28 VITALS — BP 152/78 | HR 78 | Temp 98.8°F | Ht 65.0 in | Wt 317.0 lb

## 2013-04-28 DIAGNOSIS — Z23 Encounter for immunization: Secondary | ICD-10-CM

## 2013-04-28 DIAGNOSIS — IMO0001 Reserved for inherently not codable concepts without codable children: Secondary | ICD-10-CM

## 2013-04-28 DIAGNOSIS — Z0271 Encounter for disability determination: Secondary | ICD-10-CM

## 2013-04-28 DIAGNOSIS — B354 Tinea corporis: Secondary | ICD-10-CM

## 2013-04-28 MED ORDER — CLOTRIMAZOLE 1 % EX CREA: TOPICAL_CREAM | Freq: Two times a day (BID) | CUTANEOUS | Status: DC

## 2013-04-28 MED ORDER — MELOXICAM 7.5 MG PO TABS: 15.0000 mg | ORAL_TABLET | Freq: Every morning | ORAL | Status: DC

## 2013-04-28 NOTE — Patient Instructions (Addendum)
Body Ringworm °Ringworm (tinea corporis) is a fungal infection of the skin on the body. This infection is not caused by worms, but is actually caused by a fungus. Fungus normally lives on the top of your skin and can be useful. However, in the case of ringworms, the fungus grows out of control and causes a skin infection. It can involve any area of skin on the body and can spread easily from one person to another (contagious). Ringworm is a common problem for children, but it can affect adults as well. Ringworm is also often found in athletes, especially wrestlers who share equipment and mats.  °CAUSES  °Ringworm of the body is caused by a fungus called dermatophyte. It can spread by: °· Touching other people who are infected. °· Touching infected pets. °· Touching or sharing objects that have been in contact with the infected person or pet (hats, combs, towels, clothing, sports equipment). °SYMPTOMS  °· Itchy, raised red spots and bumps on the skin. °· Ring-shaped rash. °· Redness near the border of the rash with a clear center. °· Dry and scaly skin on or around the rash. °Not every person develops a ring-shaped rash. Some develop only the red, scaly patches. °DIAGNOSIS  °Most often, ringworm can be diagnosed by performing a skin exam. Your caregiver may choose to take a skin scraping from the affected area. The sample will be examined under the microscope to see if the fungus is present.  °TREATMENT  °Body ringworm may be treated with a topical antifungal cream or ointment. Sometimes, an antifungal shampoo that can be used on your body is prescribed. You may be prescribed antifungal medicines to take by mouth if your ringworm is severe, keeps coming back, or lasts a long time.  °HOME CARE INSTRUCTIONS  °· Only take over-the-counter or prescription medicines as directed by your caregiver. °· Wash the infected area and dry it completely before applying your cream or ointment. °· When using antifungal shampoo to  treat the ringworm, leave the shampoo on the body for 3 5 minutes before rinsing.    °· Wear loose clothing to stop clothes from rubbing and irritating the rash. °· Wash or change your bed sheets every night while you have the rash. °· Have your pet treated by your veterinarian if it has the same infection. °To prevent ringworm:  °· Practice good hygiene. °· Wear sandals or shoes in public places and showers. °· Do not share personal items with others. °· Avoid touching red patches of skin on other people. °· Avoid touching pets that have bald spots or wash your hands after doing so. °SEEK MEDICAL CARE IF:  °· Your rash continues to spread after 7 days of treatment. °· Your rash is not gone in 4 weeks. °· The area around your rash becomes red, warm, tender, and swollen. °Document Released: 07/19/2000 Document Revised: 04/15/2012 Document Reviewed: 02/03/2012 °ExitCare® Patient Information ©2014 [employer]. ° °

## 2013-04-29 DIAGNOSIS — B354 Tinea corporis: Secondary | ICD-10-CM | POA: Insufficient documentation

## 2013-04-29 NOTE — Assessment & Plan Note (Signed)
Only one lesion. p/ Clotrimazole cream Education about transmission F/u as needed

## 2013-04-29 NOTE — Progress Notes (Signed)
Family Medicine Office Visit Note   Subjective:   Patient ID: Kiara Cooper, female  DOB: [birth year], 47 y.o.. MRN: [id number]   Pt that comes today for f/u her disability paperwork. She reports was never contacted about Physical Therapy Evaluation and she have not been able to contact her prior provided who did her disability evaluation in the past.  Paperwork sent by her Lawyer office is again reviewed with pt and filled to the best of my knowledge about her. The portion part about limitations can not be filled by [place] as we have discussed with pt. She will need PT eval in order to determine with accuracy what her limitations are. A copy of this form will be scanned on her medical chart.  She also will like to address a rash in her dorsal aspect of her hand she has found a couple of weeks ago. She describes it as itchy and would like to know if is contagious and what to do.   Review of Systems:  Pt denies present SOB, chest pain, palpitations, headaches, dizziness, numbness or weakness. No changes on urinary or BM habits. No unintentional weigh loss/gain.  Objective:   Physical Exam: Gen:  NAD HEENT: Moist mucous membranes  CV: Regular rate and rhythm, no murmurs rubs or gallops PULM: Clear to auscultation bilaterally. No wheezes/rales/rhonchi Skin: round lesion of 1 cm erythematous with central clearing and satellite lesions.  Assessment & Plan:

## 2013-04-29 NOTE — Assessment & Plan Note (Signed)
No PT eval yet. Pt insurance does not cover this. Pt has not contact prior provider who evaluated her for disability in the past as we discussed. Form is filled to the best of my knowledge but the limitation aspect of her condition needs PT eval.  Form will be scanned on this chart and faxed to her Lawyer.

## 2013-06-11 ENCOUNTER — Ambulatory Visit: Payer: Medicaid Other | Admitting: Pulmonary Disease

## 2013-06-14 ENCOUNTER — Ambulatory Visit (INDEPENDENT_AMBULATORY_CARE_PROVIDER_SITE_OTHER): Payer: Medicaid Other | Admitting: Pulmonary Disease

## 2013-06-14 ENCOUNTER — Encounter: Payer: Self-pay | Admitting: Pulmonary Disease

## 2013-06-14 ENCOUNTER — Encounter (INDEPENDENT_AMBULATORY_CARE_PROVIDER_SITE_OTHER): Payer: Self-pay

## 2013-06-14 VITALS — BP 128/88 | HR 77 | Temp 97.9°F | Ht 65.0 in | Wt 320.8 lb

## 2013-06-14 DIAGNOSIS — J439 Emphysema, unspecified: Secondary | ICD-10-CM

## 2013-06-14 DIAGNOSIS — J438 Other emphysema: Secondary | ICD-10-CM

## 2013-06-14 DIAGNOSIS — R0602 Shortness of breath: Secondary | ICD-10-CM

## 2013-06-14 MED ORDER — ALBUTEROL SULFATE HFA 108 (90 BASE) MCG/ACT IN AERS: 1.0000 | INHALATION_SPRAY | Freq: Four times a day (QID) | RESPIRATORY_TRACT | Status: DC | PRN

## 2013-06-14 MED ORDER — TIOTROPIUM BROMIDE MONOHYDRATE 18 MCG IN CAPS: 18.0000 ug | ORAL_CAPSULE | Freq: Every day | RESPIRATORY_TRACT | Status: DC

## 2013-06-14 MED ORDER — BUDESONIDE-FORMOTEROL FUMARATE 160-4.5 MCG/ACT IN AERO: 2.0000 | INHALATION_SPRAY | Freq: Two times a day (BID) | RESPIRATORY_TRACT | Status: DC

## 2013-06-14 NOTE — Progress Notes (Signed)
  Subjective:    Patient ID: Kiara Cooper, female    DOB: Dec 21, 1965, 47 y.o.   MRN: [id number]  HPI The pt comes in today for f/u of her copd, felt secondary to emphysema and asthma.  She has been doing ok on spiriva/ symbicort, but did have a chest cold a few weeks ago that resolved spontaneously.  She tells me that she has smoked a few cigs since the last visit due to stress, and I have encouraged her to stay away from these. Overall, she feels doe at baseline.    Review of Systems  Constitutional: Positive for fatigue. Negative for fever and unexpected weight change.  HENT: Negative for congestion, dental problem, ear pain, nosebleeds, postnasal drip, rhinorrhea, sinus pressure, sneezing, sore throat and trouble swallowing.   Eyes: Negative for redness and itching.  Respiratory: Positive for shortness of breath and wheezing. Negative for cough and chest tightness.   Cardiovascular: Negative for palpitations and leg swelling.  Gastrointestinal: Negative for nausea and vomiting.  Genitourinary: Negative for dysuria.  Musculoskeletal: Negative for joint swelling.  Skin: Negative for rash.  Neurological: Negative for headaches.  Hematological: Does not bruise/bleed easily.  Psychiatric/Behavioral: Negative for dysphoric mood. The patient is not nervous/anxious.        Objective:   Physical Exam Morbidly obese female in nad Nose without purulence or d/c noted. Neck without LN or TMG Chest with mildly decreased bs, no wheezing Cor with rrr LE with 2+ edema, no cyanosis Alert and oriented, moves all 4.        Assessment & Plan:

## 2013-06-14 NOTE — Patient Instructions (Signed)
Stay on symbicort and spiriva Keep working on weight loss and some type of exercise program.  Will send in refills for your breathing medications. Make sure you stay completely away from smoking. followup with me in 6mos, but call if you are having worsening symptoms.

## 2013-06-14 NOTE — Assessment & Plan Note (Signed)
The pt is at least stable from a copd standpoint.  She needs to continue her current BD regimen, and work on conditioning and weight loss.  She has started smoking a few cigarettes again, and I stressed to her the importance of total abstinence.

## 2013-06-21 ENCOUNTER — Ambulatory Visit (INDEPENDENT_AMBULATORY_CARE_PROVIDER_SITE_OTHER): Payer: Medicaid Other | Admitting: Family Medicine

## 2013-06-21 VITALS — BP 128/84 | HR 91 | Temp 98.8°F | Ht 65.0 in | Wt 318.0 lb

## 2013-06-21 DIAGNOSIS — L989 Disorder of the skin and subcutaneous tissue, unspecified: Secondary | ICD-10-CM

## 2013-06-21 DIAGNOSIS — E119 Type 2 diabetes mellitus without complications: Secondary | ICD-10-CM

## 2013-06-21 DIAGNOSIS — B354 Tinea corporis: Secondary | ICD-10-CM

## 2013-06-21 MED ORDER — MELOXICAM 7.5 MG PO TABS: 15.0000 mg | ORAL_TABLET | Freq: Every morning | ORAL | Status: DC

## 2013-06-21 MED ORDER — CLOTRIMAZOLE 1 % EX CREA: TOPICAL_CREAM | Freq: Two times a day (BID) | CUTANEOUS | Status: DC

## 2013-06-21 MED ORDER — BENZONATATE 100 MG PO CAPS: 100.0000 mg | ORAL_CAPSULE | Freq: Two times a day (BID) | ORAL | Status: DC | PRN

## 2013-06-21 MED ORDER — KETOCONAZOLE 2 % EX SHAM: 1.0000 "application " | MEDICATED_SHAMPOO | CUTANEOUS | Status: DC

## 2013-06-21 NOTE — Patient Instructions (Signed)
Body Ringworm °Ringworm (tinea corporis) is a fungal infection of the skin on the body. This infection is not caused by worms, but is actually caused by a fungus. Fungus normally lives on the top of your skin and can be useful. However, in the case of ringworms, the fungus grows out of control and causes a skin infection. It can involve any area of skin on the body and can spread easily from one person to another (contagious). Ringworm is a common problem for children, but it can affect adults as well. Ringworm is also often found in athletes, especially wrestlers who share equipment and mats.  °CAUSES  °Ringworm of the body is caused by a fungus called dermatophyte. It can spread by: °· Touching other people who are infected. °· Touching infected pets. °· Touching or sharing objects that have been in contact with the infected person or pet (hats, combs, towels, clothing, sports equipment). °SYMPTOMS  °· Itchy, raised red spots and bumps on the skin. °· Ring-shaped rash. °· Redness near the border of the rash with a clear center. °· Dry and scaly skin on or around the rash. °Not every person develops a ring-shaped rash. Some develop only the red, scaly patches. °DIAGNOSIS  °Most often, ringworm can be diagnosed by performing a skin exam. Your caregiver may choose to take a skin scraping from the affected area. The sample will be examined under the microscope to see if the fungus is present.  °TREATMENT  °Body ringworm may be treated with a topical antifungal cream or ointment. Sometimes, an antifungal shampoo that can be used on your body is prescribed. You may be prescribed antifungal medicines to take by mouth if your ringworm is severe, keeps coming back, or lasts a long time.  °HOME CARE INSTRUCTIONS  °· Only take over-the-counter or prescription medicines as directed by your caregiver. °· Wash the infected area and dry it completely before applying your cream or ointment. °· When using antifungal shampoo to  treat the ringworm, leave the shampoo on the body for 3 5 minutes before rinsing.    °· Wear loose clothing to stop clothes from rubbing and irritating the rash. °· Wash or change your bed sheets every night while you have the rash. °· Have your pet treated by your veterinarian if it has the same infection. °To prevent ringworm:  °· Practice good hygiene. °· Wear sandals or shoes in public places and showers. °· Do not share personal items with others. °· Avoid touching red patches of skin on other people. °· Avoid touching pets that have bald spots or wash your hands after doing so. °SEEK MEDICAL CARE IF:  °· Your rash continues to spread after 7 days of treatment. °· Your rash is not gone in 4 weeks. °· The area around your rash becomes red, warm, tender, and swollen. °Document Released: 07/19/2000 Document Revised: 04/15/2012 Document Reviewed: 02/03/2012 °ExitCare® Patient Information ©2014 [employer]. ° °

## 2013-06-22 NOTE — Progress Notes (Signed)
Family Medicine Office Visit Note   Subjective:   Patient ID: Kiara Cooper, female  DOB: 1966-02-10, 48 y.o.. MRN: [id number]   Pt that comes today for same day appointment complaining of lesion on her face that has been treated as tinea and also about pruritus in her scalp.  She reports has used topical treatment for a couple of weeks but lesion has not resolved. She is requesting oral treatment for fungal infection since this has been given to her in the past and has resolved her symptoms. She denies swelling or redness. She reports working with hat on and her scalp sweats.  No other area of skin involved at this time.   Review of Systems:  Per HPI  Objective:   Physical Exam: Gen:  NAD Skin: very fade lesion on her right eyebrow (dark decoloration) approximately 2 cm. No other lesions seen. No scalp lesion observed.   Assessment & Plan:

## 2013-06-22 NOTE — Assessment & Plan Note (Addendum)
Resolved with topical treatment. Now one lesion on her eyebrow that is resolving. No lesion on scalp seen. Continue clotrimazole topical and added ketoconazole shampoo, even though no lesion was visible. Recommended proper hygiene and avoidance of heavy hats. F/u as needed.

## 2013-07-05 ENCOUNTER — Ambulatory Visit: Payer: Medicaid Other | Admitting: Family Medicine

## 2013-08-04 ENCOUNTER — Telehealth: Payer: Self-pay | Admitting: *Deleted

## 2013-08-04 NOTE — Telephone Encounter (Signed)
Prior authorization form for Exforge (along with Medicaid preferred drug list) placed in MD box for completion.

## 2013-08-16 ENCOUNTER — Telehealth: Payer: Self-pay | Admitting: *Deleted

## 2013-08-16 DIAGNOSIS — E119 Type 2 diabetes mellitus without complications: Secondary | ICD-10-CM

## 2013-08-16 NOTE — Telephone Encounter (Signed)
Tried to call pt and inform her that she is due to have her LDL checked.  Unable to leave message due to mailbox being full.  Please inform of this and make an appt when she calls back. Jazmin Hartsell,CMA,

## 2013-09-21 IMAGING — CR DG CHEST 2V
2 series · 2 of 2 positions shown · non-contrast
Comparison: None.

CLINICAL DATA: Shortness of breath

CHEST - 2 VIEW

[w chest pa]
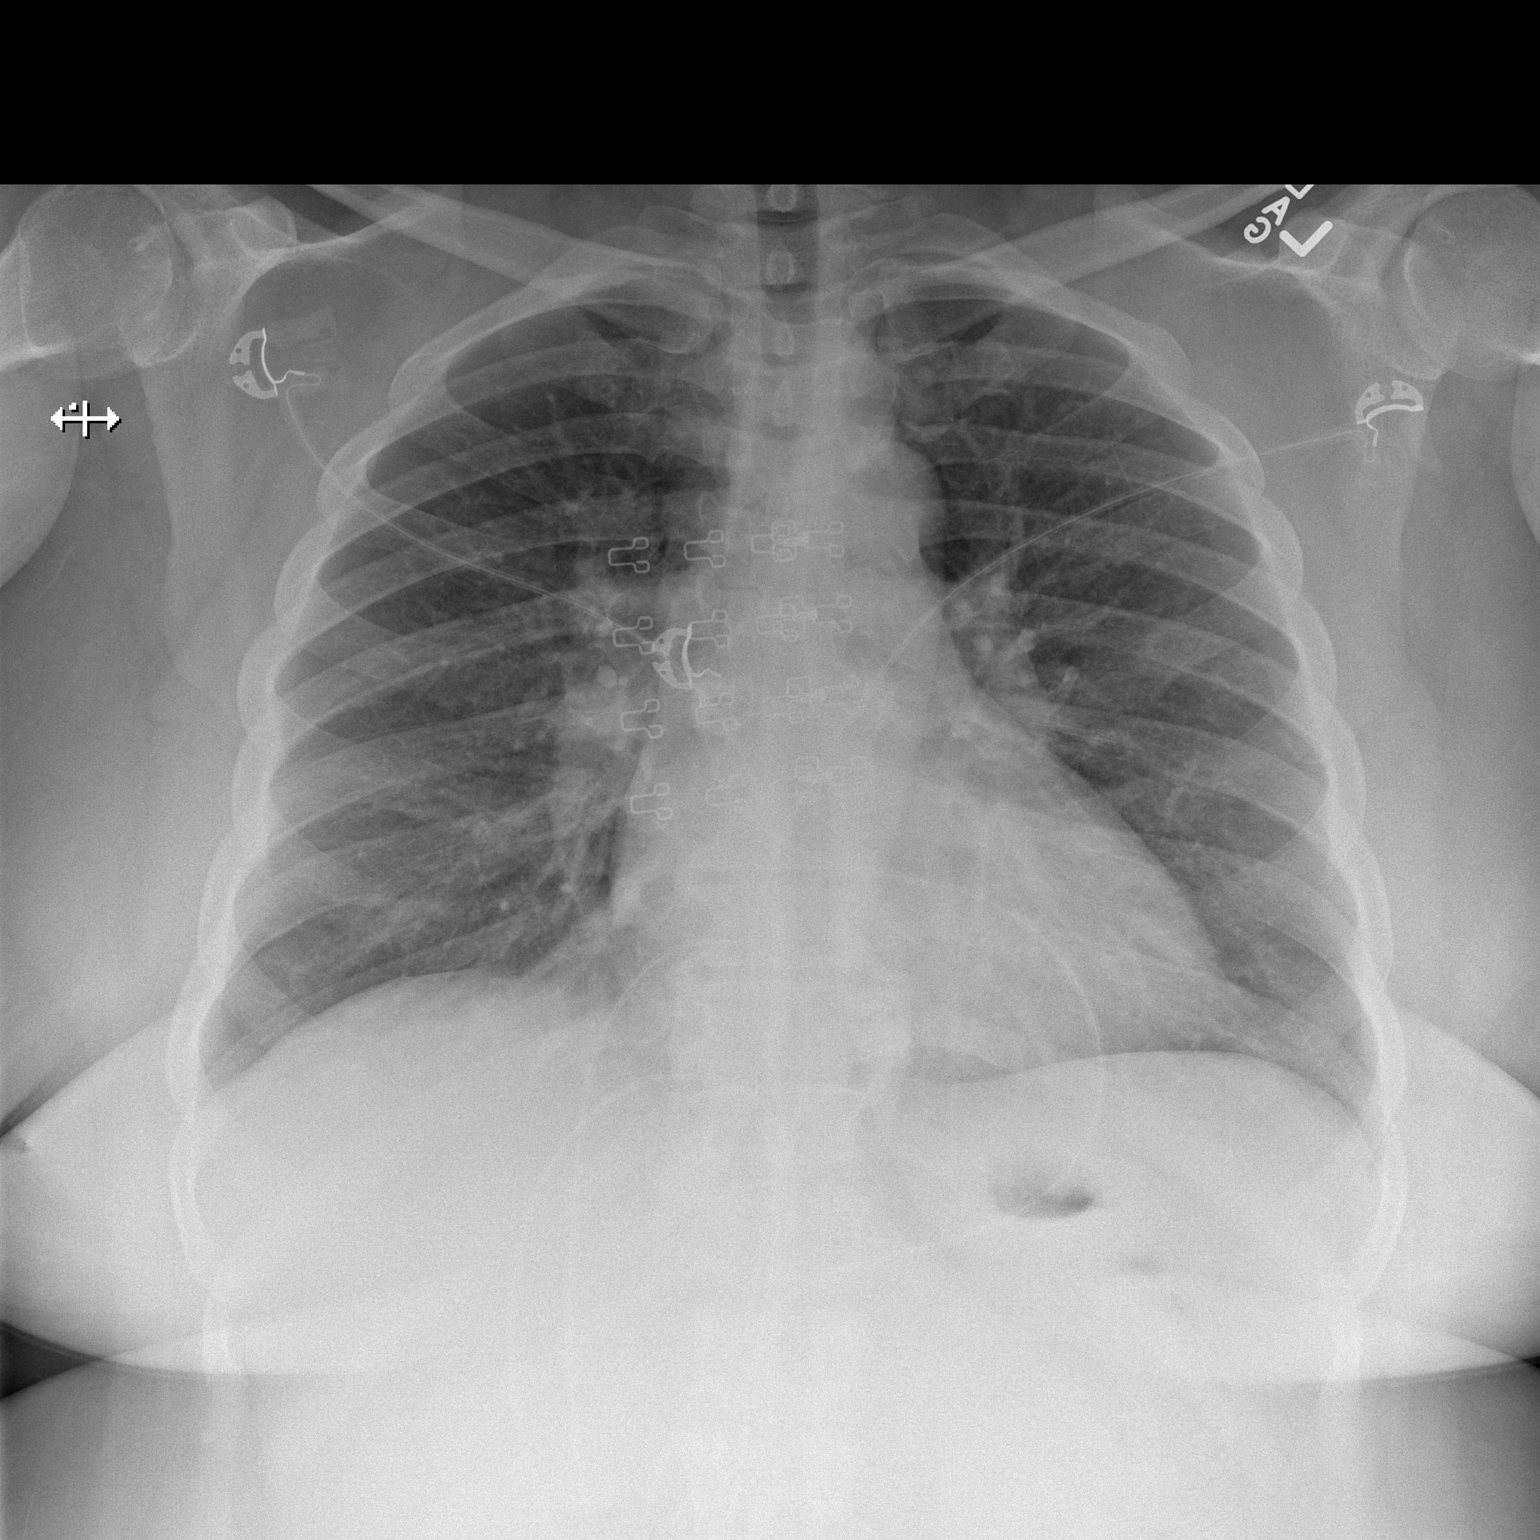

[w chest lat]
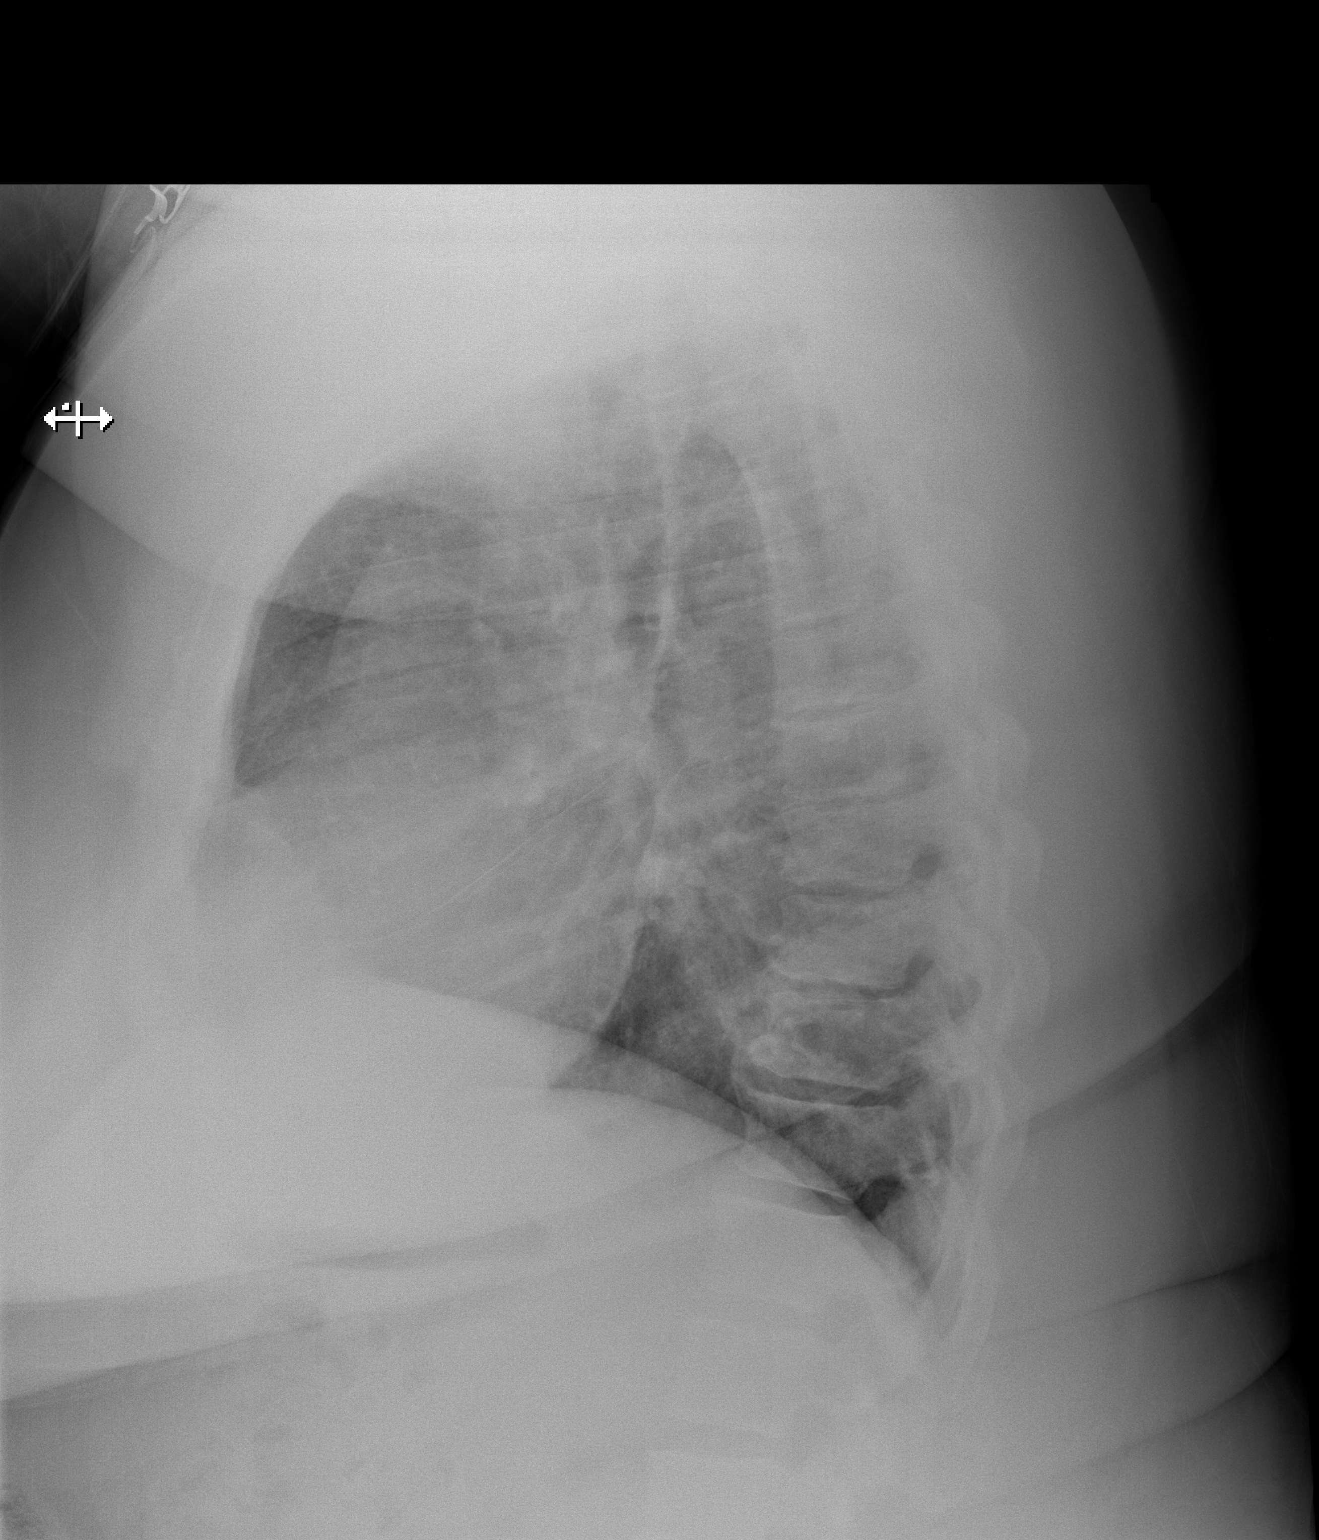

[2 of 2 positions shown; findings below may reference images not displayed]

FINDINGS: Heart size upper normal to mildly enlarged.  Mild central
vascular congestion.  No focal consolidation.  Mild interstitial
prominence.  No pleural effusion or pneumothorax.  No acute osseous
abnormality. Bilateral shoulder degenerative changes.
IMPRESSION: Heart size upper normal to mildly enlarged, with central vascular
congestion.  Mild interstitial prominence without focal
consolidation.

## 2013-10-25 ENCOUNTER — Ambulatory Visit (INDEPENDENT_AMBULATORY_CARE_PROVIDER_SITE_OTHER): Payer: Medicaid Other | Admitting: Family Medicine

## 2013-10-25 VITALS — BP 160/83 | HR 90 | Temp 98.1°F | Ht 65.0 in | Wt 325.0 lb

## 2013-10-25 DIAGNOSIS — M79609 Pain in unspecified limb: Secondary | ICD-10-CM

## 2013-10-25 DIAGNOSIS — M79642 Pain in left hand: Secondary | ICD-10-CM | POA: Insufficient documentation

## 2013-10-25 DIAGNOSIS — M79641 Pain in right hand: Secondary | ICD-10-CM | POA: Insufficient documentation

## 2013-10-25 NOTE — Progress Notes (Signed)
Family Medicine Office Visit Note   Subjective:   Patient ID: Kiara Cooper, female  DOB: 03/12/1966, 48 y.o.. MRN: [id number]   Pt that comes today complaining of hand pain bilaterally. Reports started 2 weeks ago, pain is 10/10 right hand more than left and is not resolving with mobic or tramadol that she was given in other clinic (per pt's statement). Pt declines to give us information about when or who saw her for this condition before and treatment options discussed. Denies morning stiffness, redness or swelling. Also reports her knees are hurting and the injection given did not help her. She asked specifically for narcotic pain medication to control her symptoms.  Review of Systems:  Per HPI  Objective:   Physical Exam: Gen: morbidly obese, with aggressive manners and verbally abusive to us. Refuses to be examined and left the room in a abrupt manner. Extremities: no able to exam but per observation pt initially did not want to shake my hand due to reported pain. Then, she gabbed paper she had on top of desk and starting folding it in small squares without difficulty.   Assessment & Plan:

## 2013-10-25 NOTE — Assessment & Plan Note (Signed)
History of multi substance abuse and ETOH. Declined my physical exam or xrays to evaluate her current symptoms. After discussion about options for treatment she stood up and left abruptly.

## 2013-11-29 ENCOUNTER — Encounter: Payer: Self-pay | Admitting: Pulmonary Disease

## 2013-12-13 ENCOUNTER — Ambulatory Visit: Payer: Medicaid Other | Admitting: Pulmonary Disease

## 2013-12-20 ENCOUNTER — Ambulatory Visit (INDEPENDENT_AMBULATORY_CARE_PROVIDER_SITE_OTHER): Payer: Medicaid Other | Admitting: Family Medicine

## 2013-12-20 VITALS — BP 126/86 | HR 89 | Temp 98.5°F | Ht 65.0 in | Wt 332.0 lb

## 2013-12-20 DIAGNOSIS — E669 Obesity, unspecified: Secondary | ICD-10-CM

## 2013-12-20 DIAGNOSIS — M25562 Pain in left knee: Secondary | ICD-10-CM

## 2013-12-20 DIAGNOSIS — E119 Type 2 diabetes mellitus without complications: Secondary | ICD-10-CM

## 2013-12-20 DIAGNOSIS — M1710 Unilateral primary osteoarthritis, unspecified knee: Secondary | ICD-10-CM

## 2013-12-20 DIAGNOSIS — M25569 Pain in unspecified knee: Secondary | ICD-10-CM

## 2013-12-20 DIAGNOSIS — IMO0002 Reserved for concepts with insufficient information to code with codable children: Secondary | ICD-10-CM

## 2013-12-20 DIAGNOSIS — M25561 Pain in right knee: Secondary | ICD-10-CM

## 2013-12-20 DIAGNOSIS — M1711 Unilateral primary osteoarthritis, right knee: Secondary | ICD-10-CM

## 2013-12-20 LAB — POCT GLYCOSYLATED HEMOGLOBIN (HGB A1C): Hemoglobin A1C: 6.6

## 2013-12-20 MED ORDER — MELOXICAM 7.5 MG PO TABS: 15.0000 mg | ORAL_TABLET | Freq: Every morning | ORAL | Status: AC

## 2013-12-20 MED ORDER — AMLODIPINE BESYLATE-VALSARTAN 10-320 MG PO TABS: 1.0000 | ORAL_TABLET | Freq: Every morning | ORAL | Status: DC

## 2013-12-20 NOTE — Progress Notes (Signed)
Family Medicine Office Visit Note   Subjective:   Patient ID: Kiara Cooper, female  DOB: 05/08/1966, 48 y.o.. MRN: [id number]   Pt that comes today complaining of knee pain worse on her right. She reports has had several steroid injections with no resolution of her symptoms and she is considering knee replacement. She is interested also in weight loss but unable to do it because this limitation on her activity level.   F/u DM: she reports being only on 17 units  of Lantus and her home CBG's are in the low 100's and some times in the 80-90's. She states her meals are very far apart and she feels like sometimes has to eat because her "sugars go low". She is not taking Metformin and only controlling her DM w/ insulin at this time.  Review of Systems:  Pt denies SOB, chest pain, palpitations, headaches, dizziness, numbness or weakness. No changes on urinary or BM habits.   Objective:   Physical Exam: Gen: morbidly obese,  NAD HEENT: Moist mucous membranes  CV: Regular rate and rhythm, no murmurs rubs or gallops PULM: Clear to auscultation bilaterally. No wheezes/rales/rhonchi ABD: Soft, non tender, non distended, normal bowel sounds EXT: knees with limited ROM due to reported pain. Gait is antalgic. No efusion noted but pt obesity limit this evaluation. No joint laxity.  Neuro: Alert and oriented x3. No focalization  Assessment & Plan:

## 2013-12-20 NOTE — Patient Instructions (Addendum)
I have placed a referral for Orthopedic and for Nutrition consultation. For Nutrition you need to call to schedule an appointment with the information provided. You can go down to 15 units of insulin. F/u in 3-4 months or sooner if needed.

## 2013-12-23 DIAGNOSIS — M25562 Pain in left knee: Secondary | ICD-10-CM

## 2013-12-23 DIAGNOSIS — M25561 Pain in right knee: Secondary | ICD-10-CM | POA: Insufficient documentation

## 2013-12-23 NOTE — Assessment & Plan Note (Signed)
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  Pt has had steroid injections in the past but no working much for her. She requests referral to ortho for knee replacement but unrealistic expectations since her weight makes her not a proper candidate for this type of procedure. Nevertheless after discussing this with her she agrees to work with our nutritionist to manage better her weight. Referral to ortho also placed.

## 2013-12-23 NOTE — Assessment & Plan Note (Addendum)
Controlled. Lantus can be decreased to 15 units. F/u in 3 months with foot exam and ophthalmology referral, last lipid profile was 2013. Will recheck.

## 2013-12-23 NOTE — Assessment & Plan Note (Signed)
Dietitian info given and referral placed.

## 2013-12-31 ENCOUNTER — Telehealth: Payer: Self-pay | Admitting: *Deleted

## 2013-12-31 NOTE — Telephone Encounter (Signed)
Received a call from [facility] stating that a prior authorization for Exforge is needed.  Pt called [facility] very upset that PA was not completed.  PA form placed in provider box for completion.  Clovis Pu, RN

## 2014-01-04 ENCOUNTER — Telehealth: Payer: Self-pay | Admitting: Family Medicine

## 2014-01-04 NOTE — Telephone Encounter (Signed)
Patient calling inquiring status of her ortho surgery referral. Patient's pain is gradually getting worse.

## 2014-01-06 NOTE — Telephone Encounter (Signed)
PA pending per [employer] [place] [employer] for exforge.  Confirmation number:  [id number].  Clovis Pu, RN

## 2014-01-06 NOTE — Telephone Encounter (Signed)
Received PA approval for Exforge via [employer] [employer].  Med approved for 01/06/2014 - 01/06/2015.  [facility] pharmacy informed.  PA confirmation number [id number]. Clovis Pu, RN

## 2014-01-11 NOTE — Telephone Encounter (Signed)
LMOVM for pt to return call.  Appt is as follows:  Kiara Cooper 01/13/2014 @ 330pm Dr. Madelon Lips [address] [facility] ([place]) Phone: [phone]

## 2014-01-12 NOTE — Telephone Encounter (Signed)
Pt informed but changed appt to 01/18/14 @ 11am. Kiara Cooper, Kiara Cooper

## 2014-01-20 ENCOUNTER — Ambulatory Visit: Payer: Medicaid Other | Admitting: Pulmonary Disease

## 2014-02-11 ENCOUNTER — Ambulatory Visit (INDEPENDENT_AMBULATORY_CARE_PROVIDER_SITE_OTHER): Payer: Medicaid Other | Admitting: Pulmonary Disease

## 2014-02-11 ENCOUNTER — Encounter: Payer: Self-pay | Admitting: Pulmonary Disease

## 2014-02-11 VITALS — BP 160/90 | HR 78 | Temp 98.7°F | Ht 65.0 in | Wt 334.2 lb

## 2014-02-11 DIAGNOSIS — J438 Other emphysema: Secondary | ICD-10-CM

## 2014-02-11 NOTE — Assessment & Plan Note (Signed)
The patient appears to be stable from a COPD standpoint. She is staying on her maintenance regimen, and has not had a recent acute exacerbation. I've also reminded her that her obesity and deconditioning are thick contributor to her shortness of breath as well. She is to continue on her medications, work on some type of conditioning program, and finally work on weight loss.

## 2014-02-11 NOTE — Progress Notes (Signed)
   Subjective:    Patient ID: Kiara Cooper, female    DOB: 06/02/1966, 48 y.o.   MRN: [id number]  HPI The patient comes in today for followup of her known COPD. She's been staying on her bronchodilator regimen consistently, and feels that overall her breathing is at baseline. She has had no recent acute exacerbation or pulmonary infection. She has had increasing issues with the high heat and humidity, and has been trying to avoid as much as possible.   Review of Systems  Constitutional: Negative for fever and unexpected weight change.  HENT: Negative for congestion, dental problem, ear pain, nosebleeds, postnasal drip, rhinorrhea, sinus pressure, sneezing, sore throat and trouble swallowing.   Eyes: Negative for redness and itching.  Respiratory: Positive for shortness of breath. Negative for cough, chest tightness and wheezing.   Cardiovascular: Negative for palpitations and leg swelling.  Gastrointestinal: Negative for nausea and vomiting.  Genitourinary: Negative for dysuria.  Musculoskeletal: Negative for joint swelling.  Skin: Negative for rash.  Neurological: Negative for headaches.  Hematological: Does not bruise/bleed easily.  Psychiatric/Behavioral: Negative for dysphoric mood. The patient is not nervous/anxious.        Objective:   Physical Exam Morbidly obese female in no acute distress Nose without purulence or discharge noted Neck without lymphadenopathy or thyromegaly Chest with mildly decreased breath sounds, no wheezing Cardiac exam with regular rate and rhythm Lower extremities with mild edema, no cyanosis Alert and oriented, moves all 4 extremities.       Assessment & Plan:

## 2014-02-11 NOTE — Patient Instructions (Signed)
Continue on your current breathing medications Try and stay as active as possible, and keep working on weight loss followup with me again ini 6mos.

## 2014-03-27 IMAGING — CR DG CHEST 1V PORT
1 series · 1 of 1 positions shown · non-contrast
Comparison: 11/07/2011

CLINICAL DATA: Short of breath

PORTABLE CHEST - 1 VIEW

[AP]
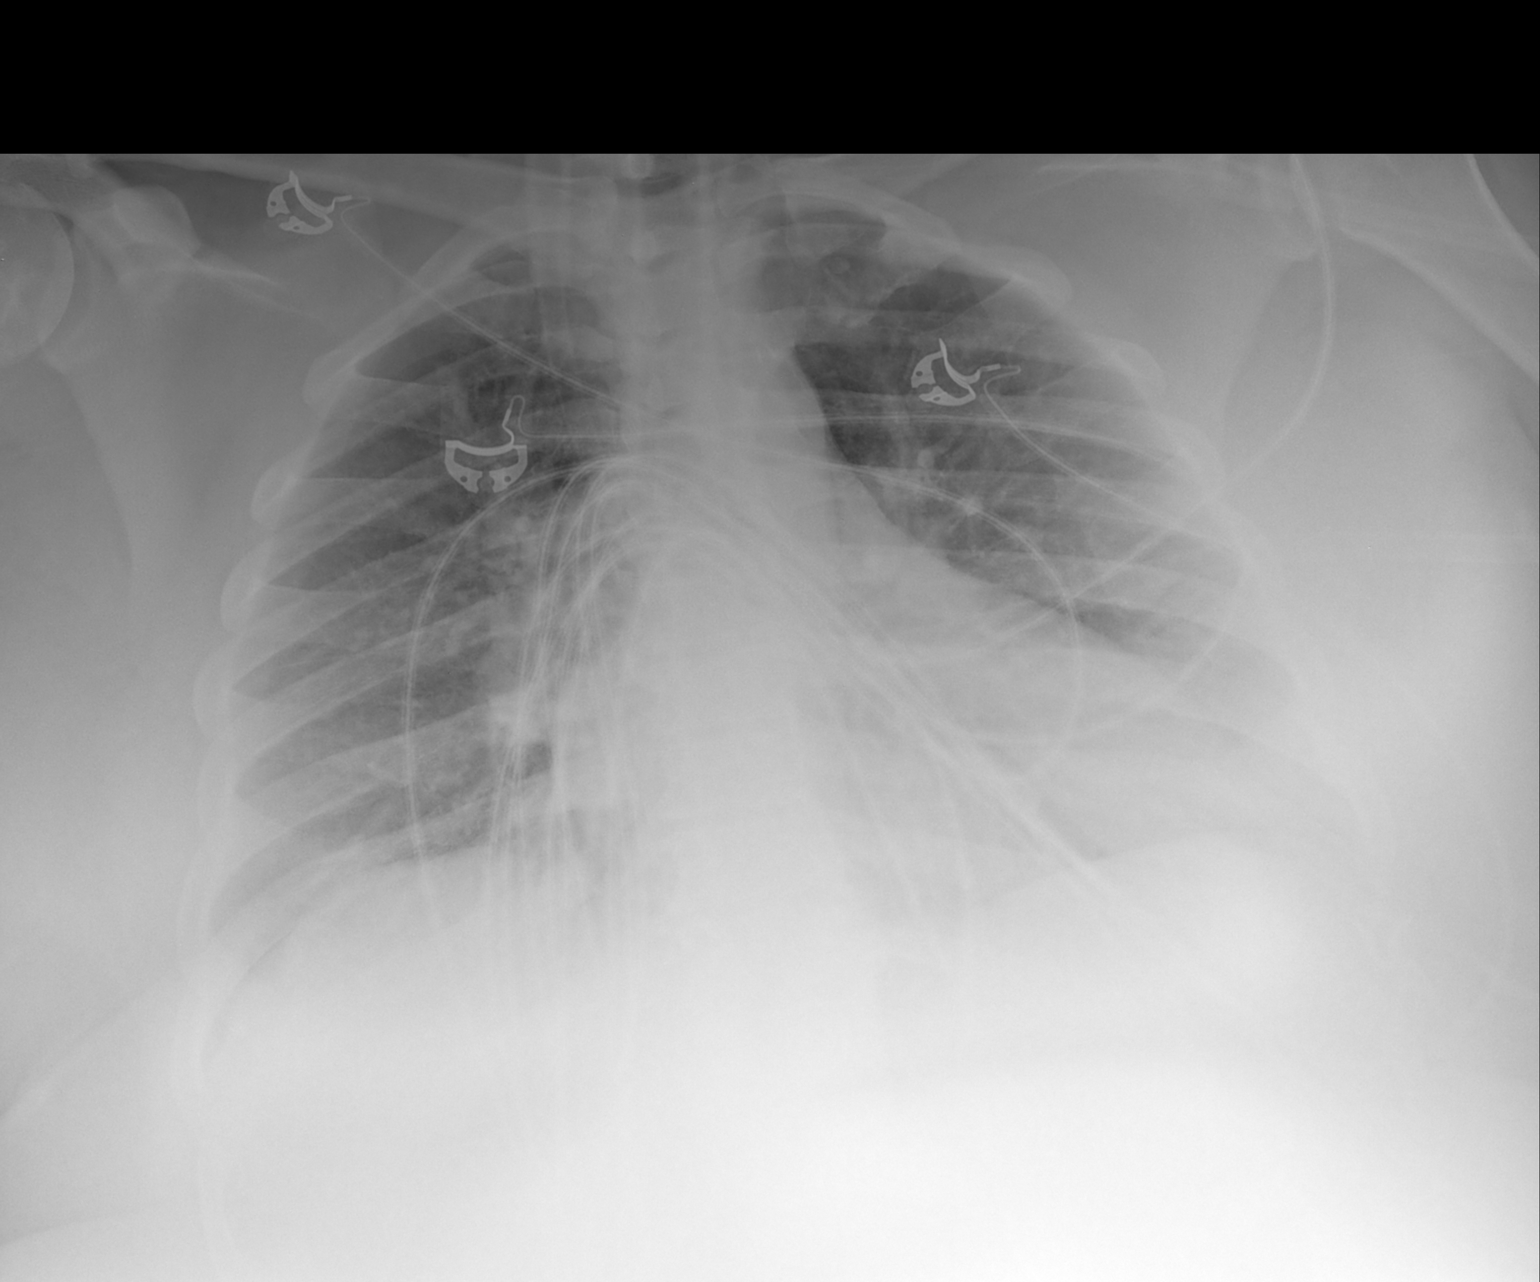

[1 of 1 positions shown; findings below may reference images not displayed]

FINDINGS: Cardiac enlargement with mild vascular congestion.
Negative for edema or effusion.  Negative for pneumonia.
IMPRESSION: Pulmonary vascular congestion without edema.

## 2014-06-13 ENCOUNTER — Ambulatory Visit: Payer: Medicaid Other | Admitting: Family Medicine

## 2014-08-10 ENCOUNTER — Encounter: Payer: Self-pay | Admitting: Pulmonary Disease

## 2014-08-19 ENCOUNTER — Ambulatory Visit: Payer: Medicaid Other | Admitting: Pulmonary Disease

## 2014-09-09 ENCOUNTER — Encounter: Payer: Self-pay | Admitting: Pulmonary Disease

## 2014-09-09 ENCOUNTER — Ambulatory Visit (INDEPENDENT_AMBULATORY_CARE_PROVIDER_SITE_OTHER): Payer: Self-pay | Admitting: Pulmonary Disease

## 2014-09-09 VITALS — BP 128/72 | HR 77 | Temp 97.0°F | Ht 65.0 in | Wt 299.2 lb

## 2014-09-09 DIAGNOSIS — J438 Other emphysema: Secondary | ICD-10-CM

## 2014-09-09 MED ORDER — BUDESONIDE-FORMOTEROL FUMARATE 160-4.5 MCG/ACT IN AERO: 2.0000 | INHALATION_SPRAY | Freq: Two times a day (BID) | RESPIRATORY_TRACT | Status: DC

## 2014-09-09 MED ORDER — BUDESONIDE-FORMOTEROL FUMARATE 160-4.5 MCG/ACT IN AERO: 2.0000 | INHALATION_SPRAY | Freq: Two times a day (BID) | RESPIRATORY_TRACT | Status: AC

## 2014-09-09 MED ORDER — TIOTROPIUM BROMIDE MONOHYDRATE 18 MCG IN CAPS: 18.0000 ug | ORAL_CAPSULE | Freq: Every day | RESPIRATORY_TRACT | Status: AC

## 2014-09-09 MED ORDER — ALBUTEROL SULFATE HFA 108 (90 BASE) MCG/ACT IN AERS: 1.0000 | INHALATION_SPRAY | Freq: Four times a day (QID) | RESPIRATORY_TRACT | Status: AC | PRN

## 2014-09-09 NOTE — Patient Instructions (Signed)
Continue on Symbicort and Spiriva Continue on weight loss, you were doing very well. Follow-up with me again in 6 months.

## 2014-09-09 NOTE — Progress Notes (Signed)
   Subjective:    Patient ID: Kiara Cooper, female    DOB: 10/01/1965, 49 y.o.   MRN: [id number]  HPI The patient comes in today for follow-up of her known COPD. She has been staying on her bronchodilator regimen, and has even lost over 30 pounds since the last visit. She is working on a Product managerconditioning program, and is improving her diet. I have commended her on this. She tells me that she has not had an acute exacerbation since the last visit. She continues to have chronic dyspnea on exertion, but I explained with her weight and degree of lung disease, this is not unexpected. This will improve with conditioning and weight loss.   Review of Systems  Constitutional: Negative for fever and unexpected weight change.  HENT: Positive for congestion and postnasal drip. Negative for dental problem, ear pain, nosebleeds, rhinorrhea, sinus pressure, sneezing, sore throat and trouble swallowing.   Eyes: Negative for redness and itching.  Respiratory: Positive for cough and shortness of breath. Negative for chest tightness and wheezing.   Cardiovascular: Positive for leg swelling. Negative for palpitations.  Gastrointestinal: Negative for nausea and vomiting.  Genitourinary: Negative for dysuria.  Musculoskeletal: Negative for joint swelling.  Skin: Negative for rash.  Neurological: Negative for headaches.  Hematological: Does not bruise/bleed easily.  Psychiatric/Behavioral: Negative for dysphoric mood. The patient is not nervous/anxious.        Objective:   Physical Exam Morbidly obese female in no acute distress Nose without purulence or discharge noted Neck without lymphadenopathy or thyromegaly Chest with mildly decreased breath sounds, no wheezes or crackles Cardiac exam with regular rate and rhythm Lower extremities with trace edema, no cyanosis Alert and oriented, moves all 4 extremities.       Assessment & Plan:

## 2014-09-09 NOTE — Assessment & Plan Note (Signed)
The patient is doing very well from a COPD standpoint on her current medications, and has even lost over 30 pounds since the last visit. I have asked her to continue on her current medicines, and to keep working on her conditioning program and further weight loss.

## 2014-09-18 IMAGING — CR DG CHEST 2V
2 series · 2 of 2 positions shown · non-contrast
Comparison: 09/29/2012

CLINICAL DATA: Shortness of breath, wheezing

CHEST - 2 VIEW

[w chest lat]
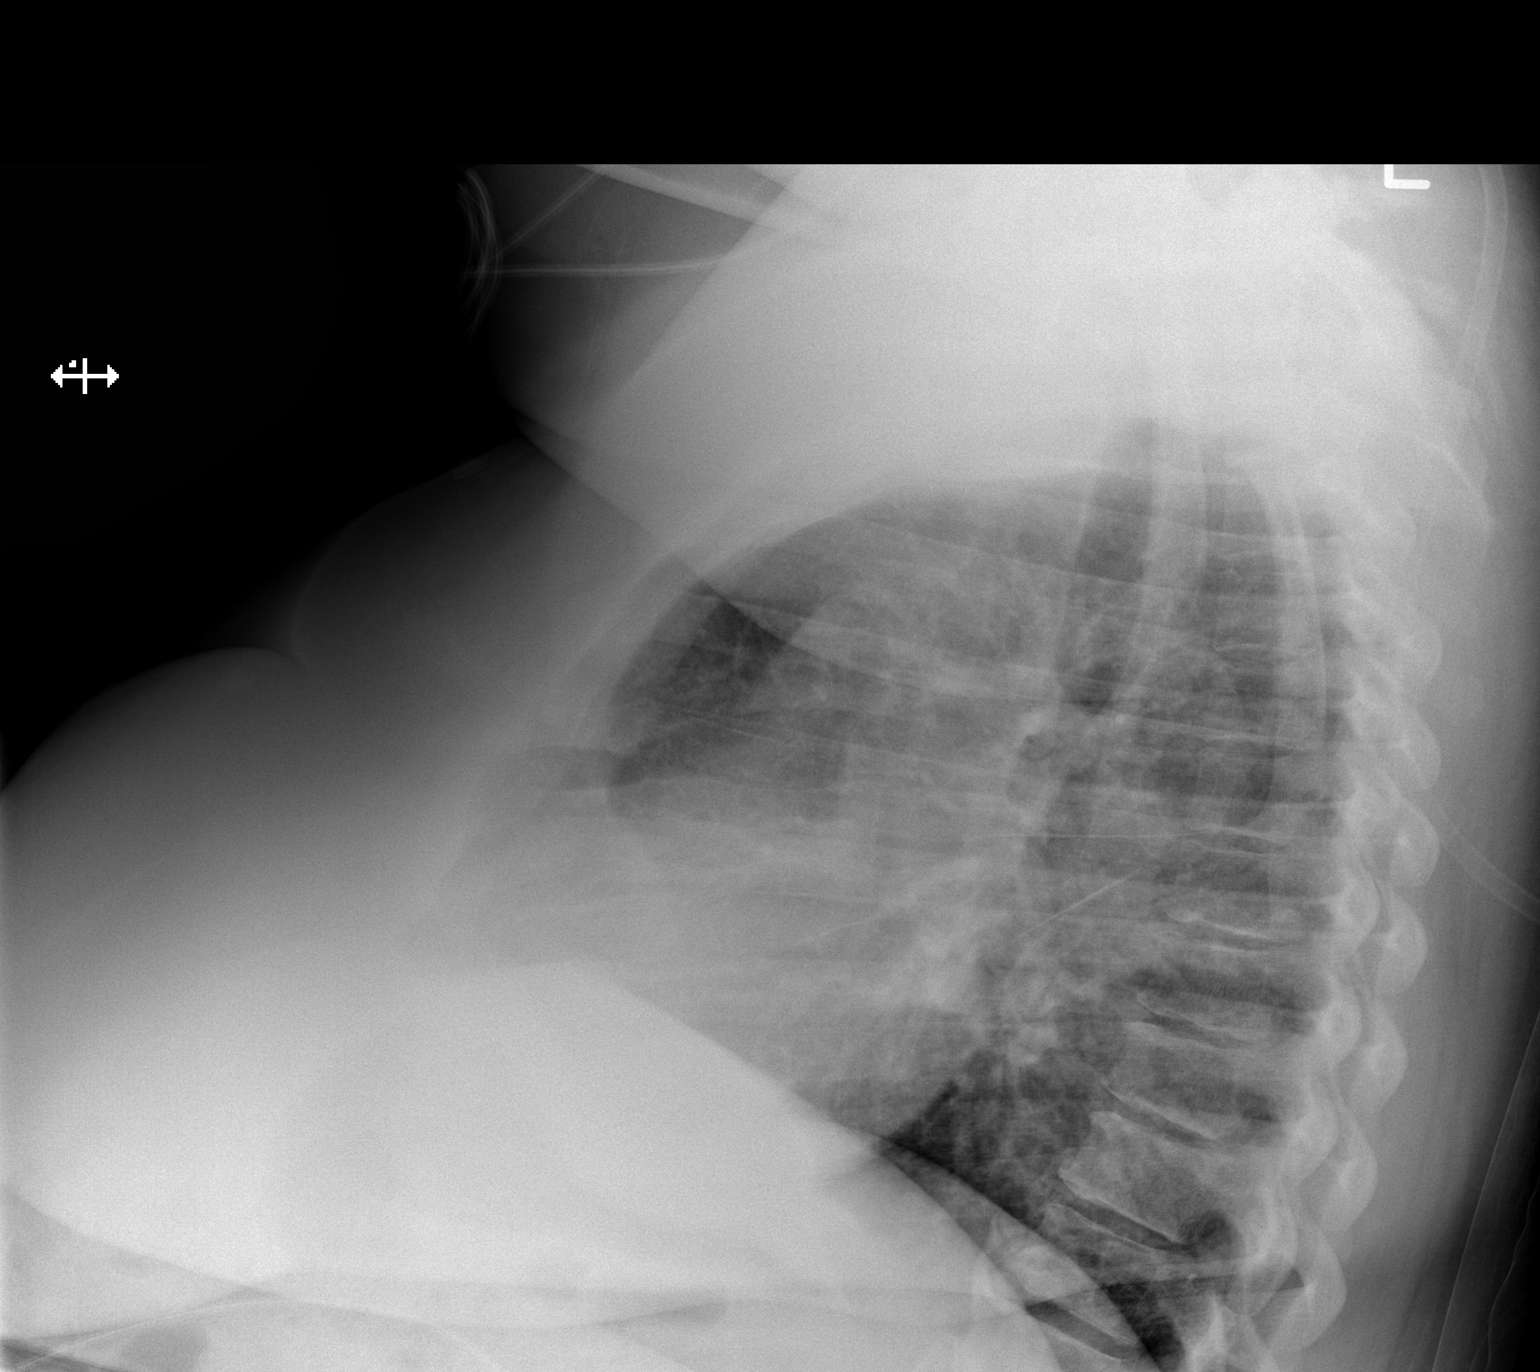

[x chest ap]
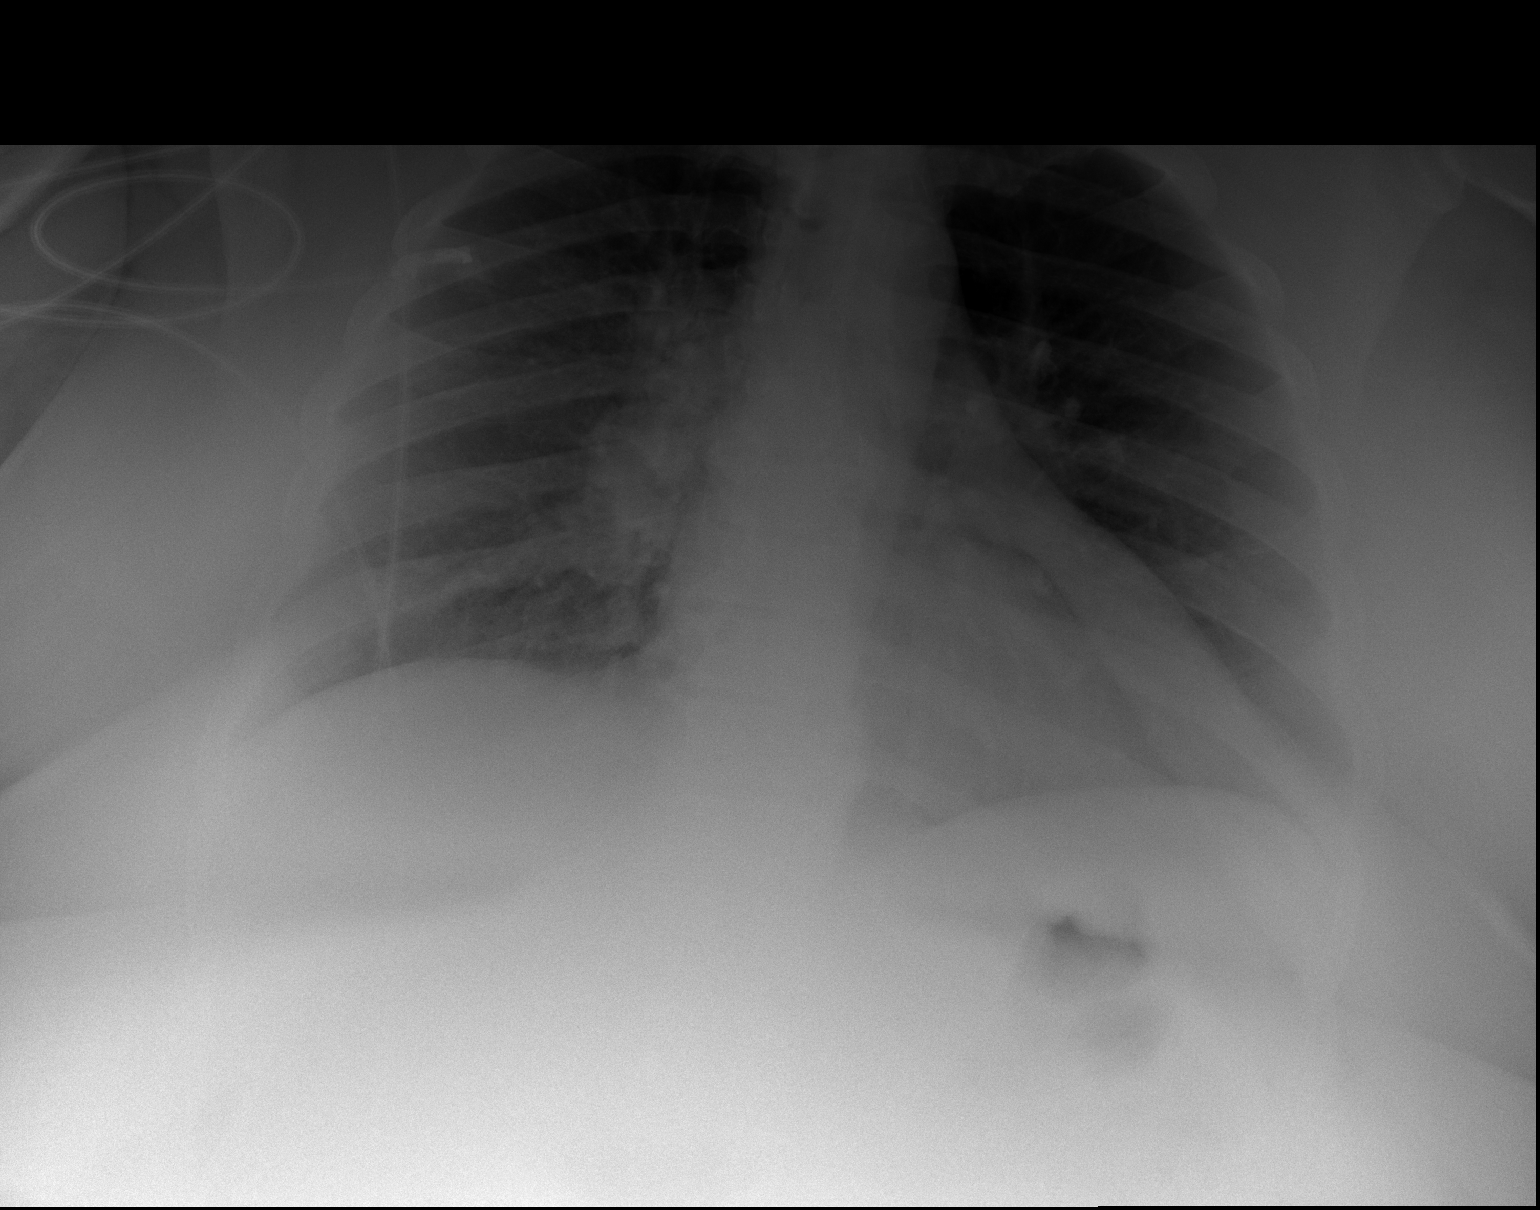

[2 of 2 positions shown; findings below may reference images not displayed]

FINDINGS: Hypoaeration.  Interstitial and vascular crowding.
Cardiomediastinal prominence is similar to prior.  No confluent
airspace opacity.  No pleural effusion or pneumothorax.  No acute
osseous finding.
IMPRESSION: Prominent cardiomediastinal contours are similar to prior.

Hypoaeration with interstitial vascular crowding.  No confluent
airspace opacity.

## 2015-01-26 ENCOUNTER — Encounter: Payer: Self-pay | Admitting: Pulmonary Disease

## 2015-03-10 ENCOUNTER — Ambulatory Visit: Payer: Medicaid Other | Admitting: Pulmonary Disease

## 2015-05-10 ENCOUNTER — Ambulatory Visit: Payer: Medicaid Other | Admitting: Internal Medicine

## 2015-06-27 ENCOUNTER — Ambulatory Visit: Payer: Medicaid Other | Admitting: Internal Medicine

## 2016-12-11 ENCOUNTER — Telehealth: Payer: Self-pay | Admitting: Family Medicine
